# Patient Record
Sex: Male | Born: 1955 | Race: White | Hispanic: No | Marital: Married | Smoking: Never smoker
Health system: Southern US, Community
[De-identification: ages and names within clinical notes are randomized; demographics above are authoritative.]

## PROBLEM LIST (undated history)

## (undated) DIAGNOSIS — E785 Hyperlipidemia, unspecified: Secondary | ICD-10-CM

## (undated) DIAGNOSIS — I10 Essential (primary) hypertension: Secondary | ICD-10-CM

## (undated) DIAGNOSIS — L570 Actinic keratosis: Secondary | ICD-10-CM

## (undated) DIAGNOSIS — C61 Malignant neoplasm of prostate: Secondary | ICD-10-CM

## (undated) DIAGNOSIS — R972 Elevated prostate specific antigen [PSA]: Secondary | ICD-10-CM

## (undated) HISTORY — DX: Essential (primary) hypertension: I10

## (undated) HISTORY — DX: Elevated prostate specific antigen (PSA): R97.20

## (undated) HISTORY — DX: Actinic keratosis: L57.0

## (undated) HISTORY — PX: HERNIA REPAIR: SHX51

## (undated) HISTORY — DX: Hyperlipidemia, unspecified: E78.5

---

## 2015-02-25 DIAGNOSIS — R972 Elevated prostate specific antigen [PSA]: Secondary | ICD-10-CM | POA: Insufficient documentation

## 2015-02-25 DIAGNOSIS — E7800 Pure hypercholesterolemia, unspecified: Secondary | ICD-10-CM | POA: Insufficient documentation

## 2015-02-25 DIAGNOSIS — I10 Essential (primary) hypertension: Secondary | ICD-10-CM | POA: Insufficient documentation

## 2015-03-01 ENCOUNTER — Ambulatory Visit (INDEPENDENT_AMBULATORY_CARE_PROVIDER_SITE_OTHER): Payer: 59 | Admitting: Urology

## 2015-03-01 ENCOUNTER — Encounter: Payer: Self-pay | Admitting: Urology

## 2015-03-01 VITALS — BP 164/95 | HR 134 | Ht 71.0 in | Wt 214.9 lb

## 2015-03-01 DIAGNOSIS — N401 Enlarged prostate with lower urinary tract symptoms: Secondary | ICD-10-CM

## 2015-03-01 DIAGNOSIS — N138 Other obstructive and reflux uropathy: Secondary | ICD-10-CM

## 2015-03-01 DIAGNOSIS — R972 Elevated prostate specific antigen [PSA]: Secondary | ICD-10-CM

## 2015-03-01 NOTE — Progress Notes (Addendum)
03/01/2015 2:38 PM   Charna Busman 08-23-1955 YE:[id number]  Referring provider: [name], MD [place] [facility] [place], [place] [address]  Chief Complaint  Patient presents with  . Elevated PSA    referred by Dr. Doy Hutching    HPI: Patient is a 59 year old Caucasian male with a PSA of 36.01 ng/mL on 02/18/2015 referred to [place] by his PCP, Dr. Doy Hutching, for further evaluation and management.  He states he has not seen a doctor in a good while and he does not have any previous PSA's.  He has no urinary complaints at this time.  He does not admit to incontinence.  His IPSS score is 4/1.  He denies any difficulty with erections.  He is not having painful erections or curvatures with erections.  His SHIM score is 25.  He has no family history of prostate cancer.        IPSS      03/01/15 1400       International Prostate Symptom Score   How often have you had the sensation of not emptying your bladder? Not at All     How often have you had to urinate less than every two hours? Less than 1 in 5 times     How often have you found you stopped and started again several times when you urinated? Less than 1 in 5 times     How often have you found it difficult to postpone urination? Less than 1 in 5 times     How often have you had a weak urinary stream? Not at All     How often have you had to strain to start urination? Not at All     How many times did you typically get up at night to urinate? 1 Time     Total IPSS Score 4     Quality of Life due to urinary symptoms   If you were to spend the rest of your life with your urinary condition just the way it is now how would you feel about that? Pleased        Score:  1-7 Mild 8-19 Moderate 20-35 Severe      SHIM      03/01/15 1416       SHIM: Over the last 6 months:   How do you rate your confidence that you could get and keep an erection? Very High     When you had erections with sexual stimulation, how  often were your erections hard enough for penetration (entering your partner)? Almost Always or Always     During sexual intercourse, how often were you able to maintain your erection after you had penetrated (entered) your partner? Not Difficult     During sexual intercourse, how difficult was it to maintain your erection to completion of intercourse? Not Difficult     When you attempted sexual intercourse, how often was it satisfactory for you? Not Difficult     SHIM Total Score   SHIM 25        Score: 1-7 Severe ED 8-11 Moderate ED 12-16 Mild-Moderate ED 17-21 Mild ED 22-25 No ED  He denies any gross hematuria, suprapubic pain or dysuria.  He is not experiencing any fevers, chills, nausea or vomiting.  He has not had any bone pain, appetite loss or weight loss.     PMH: Past Medical History  Diagnosis Date  . Elevated PSA   . HLD (  hyperlipidemia)   . HTN (hypertension)     Surgical History: Past Surgical History  Procedure Laterality Date  . Hernia repair      Home Medications:    Medication List    Notice  As of 03/01/2015  2:38 PM   You have not been prescribed any medications.      Allergies: No Known Allergies  Family History: Family History  Problem Relation Age of Onset  . Kidney disease Neg Hx   . Prostate cancer Neg Hx     Social History:  reports that he has never smoked. He does not have any smokeless tobacco history on file. He reports that he drinks alcohol. He reports that he does not use illicit drugs.  ROS: UROLOGY Frequent Urination?: No Hard to postpone urination?: No Burning/pain with urination?: No Get up at night to urinate?: No Leakage of urine?: No Urine stream starts and stops?: No Trouble starting stream?: No Do you have to strain to urinate?: No Blood in urine?: No Urinary tract infection?: No Sexually transmitted disease?: No Injury to kidneys or bladder?: No Painful intercourse?: No Weak stream?: No Erection problems?:  No Penile pain?: No  Gastrointestinal Nausea?: No Vomiting?: No Indigestion/heartburn?: No Diarrhea?: No Constipation?: No  Constitutional Fever: No Night sweats?: No Weight loss?: No Fatigue?: No  Skin Skin rash/lesions?: No Itching?: No  Eyes Blurred vision?: No Double vision?: No  Ears/Nose/Throat Sore throat?: No Sinus problems?: No  Hematologic/Lymphatic Swollen glands?: No Easy bruising?: No  Cardiovascular Leg swelling?: No Chest pain?: No  Respiratory Cough?: No Shortness of breath?: No  Endocrine Excessive thirst?: No  Musculoskeletal Back pain?: No Joint pain?: No  Neurological Headaches?: No Dizziness?: No  Psychologic Depression?: No Anxiety?: No  Physical Exam: BP 164/95 mmHg  Pulse 134  Ht 5\' 11"  (1.803 m)  Wt 214 lb 14.4 oz (97.478 kg)  BMI 29.99 kg/m2  Constitutional: Well nourished. Alert and oriented, No acute distress. HEENT: [place] AT, moist mucus membranes. Trachea midline, no masses. Cardiovascular: No clubbing, cyanosis, or edema. Respiratory: Normal respiratory effort, no increased work of breathing. GI: Abdomen is soft, non tender, non distended, no abdominal masses. Liver and spleen not palpable.  No hernias appreciated.  Stool sample for occult testing is not indicated.   GU: No CVA tenderness.  No bladder fullness or masses.  Patient with circumcised phallus.  Urethral meatus is patent.  No penile discharge. No penile lesions or rashes. Scrotum without lesions, cysts, rashes and/or edema.  Testicles are located scrotally bilaterally. No masses are appreciated in the testicles. Left and right epididymis are normal. Rectal: Patient with  normal sphincter tone. Anus and perineum without scarring or rashes. No rectal masses are appreciated. Prostate is approximately 45 grams, no nodules are appreciated. Seminal vesicles are normal. Skin: No rashes, bruises or suspicious lesions. Lymph: No cervical or inguinal  adenopathy. Neurologic: Grossly intact, no focal deficits, moving all 4 extremities. Psychiatric: Normal mood and affect.  Laboratory Data: PSA History  36.01 ng/mL on 02/18/2015  Assessment & Plan:    1. Elevated PSA:    Patient's PSA was found to be 36.01 ng/mL on a routine exam.  I will repeat the value today.  I did discuss a prostate biopsy with the patient.  The procedure is explained and the risks involved, such as blood in urine, blood in stool, blood in semen, infection, urinary retention, and on rare occasions sepsis and death.  Patient understands the risks as explained to him.  I stated  that if the PSA returned elevated, I would recommend undergoing a biopsy.    - PSA  2. BPH with LUTS:   IPSS score is 4/1.  We will continue to monitor.  Patient may be undergoing a prostate biopsy in the future pending repeated PSA results.     Return for pending labs.  Zara Council, [facility] [address] [place], [place] [address] [phone]

## 2015-03-02 ENCOUNTER — Telehealth: Payer: Self-pay

## 2015-03-02 LAB — PSA: PROSTATE SPECIFIC AG, SERUM: 33.2 ng/mL — AB (ref 0.0–4.0)

## 2015-03-02 NOTE — Telephone Encounter (Signed)
Spoke with pt in reference to PSA and prostate bx. Pt stated that he wanted to call back to make appt. Pt stated he wanted to talk bx over with his wife.

## 2015-03-02 NOTE — Telephone Encounter (Signed)
-----   Message from Nori Riis, PA-C sent at 03/02/2015  8:58 AM EST ----- Patient's PSA did not lessen.  He will need to be scheduled for the biopsy.

## 2015-03-31 ENCOUNTER — Other Ambulatory Visit: Payer: Self-pay | Admitting: Urology

## 2015-03-31 ENCOUNTER — Ambulatory Visit (INDEPENDENT_AMBULATORY_CARE_PROVIDER_SITE_OTHER): Payer: BLUE CROSS/BLUE SHIELD | Admitting: Urology

## 2015-03-31 VITALS — BP 191/108 | HR 97 | Ht 71.0 in | Wt 195.0 lb

## 2015-03-31 DIAGNOSIS — R972 Elevated prostate specific antigen [PSA]: Secondary | ICD-10-CM | POA: Diagnosis not present

## 2015-03-31 MED ORDER — GENTAMICIN SULFATE 40 MG/ML IJ SOLN
80.0000 mg | Freq: Once | INTRAMUSCULAR | Status: AC
  Administered 2015-03-31: 80 mg via INTRAMUSCULAR

## 2015-03-31 MED ORDER — LEVOFLOXACIN 500 MG PO TABS
500.0000 mg | ORAL_TABLET | Freq: Once | ORAL | Status: AC
  Administered 2015-03-31: 500 mg via ORAL

## 2015-03-31 NOTE — Patient Instructions (Signed)

## 2015-03-31 NOTE — Progress Notes (Signed)
   03/31/2015 11:40 AM   Charna Busman Nov 24, 1955 [id number]  Referring provider: [name], MD [facility] [place], [place] [address]  Chief Complaint  Patient presents with  . Prostate Biopsy    HPI: 60 year old male with elevated PSA to 36 on routine physical exam.  Prostate was unremarkable on exam.  He has no LUTS/ ED.    No family history of prostate cancer.  He returns to the office today for a prostate biopsy.   PMH: Past Medical History  Diagnosis Date  . Elevated PSA   . HLD (hyperlipidemia)   . HTN (hypertension)     Surgical History: Past Surgical History  Procedure Laterality Date  . Hernia repair      Home Medications:    Medication List    Notice  As of 03/31/2015 11:40 AM   You have not been prescribed any medications.      Allergies: No Known Allergies  Family History: Family History  Problem Relation Age of Onset  . Kidney disease Neg Hx   . Prostate cancer Neg Hx     Social History:  reports that he has never smoked. He does not have any smokeless tobacco history on file. He reports that he drinks alcohol. He reports that he does not use illicit drugs.  Physical Exam: BP 191/108 mmHg  Pulse 97  Ht 5\' 11"  (1.803 m)  Wt 195 lb (88.451 kg)  BMI 27.21 kg/m2  Constitutional:  Alert and oriented, No acute distress. HEENT: [place] AT, moist mucus membranes.  Trachea midline, no masses. Cardiovascular: No clubbing, cyanosis, or edema. Respiratory: Normal respiratory effort, no increased work of breathing. GI: Abdomen is soft, nontender, nondistended, no abdominal masses GU: No CVA tenderness.   Rectal exam: Exam repeated today which shows 40 cc prostate, no nodules, nontender. Normal external sphincter. Skin: No rashes, bruises or suspicious lesions. Neurologic: Grossly intact, no focal deficits, moving all 4 extremities. Psychiatric: Normal mood and affect.  Prostate Biopsy Procedure   Informed  consent was obtained after discussing risks/benefits of the procedure.  A time out was performed to ensure correct patient identity.  Pre-Procedure: - Last PSA Level: 36 - Gentamicin given prophylactically - Levaquin 500 mg administered PO -Transrectal Ultrasound performed revealing a 39 gm prostate -No significant hypoechoic or median lobe noted  Procedure: - Prostate block performed using 10 cc 1% lidocaine and biopsies taken from sextant areas, a total of 12 under ultrasound guidance.  Post-Procedure: - Patient tolerated the procedure well - He was counseled to seek immediate medical attention if experiences any severe pain, significant bleeding, or fevers - Return in one week to discuss biopsy results  Assessment & Plan:    1. Elevated PSA S/p uncomplicated biopsy - gentamicin (GARAMYCIN) injection 80 mg; Inject 2 mLs (80 mg total) into the muscle once. - levofloxacin (LEVAQUIN) tablet 500 mg; Take 1 tablet (500 mg total) by mouth once.  Return for f/u already scheduled for biopsy results.  Hollice Espy, MD  [facility] [address], [place], [place] [address] [phone]

## 2015-04-06 ENCOUNTER — Telehealth: Payer: Self-pay | Admitting: Urology

## 2015-04-06 DIAGNOSIS — C61 Malignant neoplasm of prostate: Secondary | ICD-10-CM

## 2015-04-06 LAB — PATHOLOGY REPORT

## 2015-04-06 NOTE — Telephone Encounter (Addendum)
Called to discuss prostate biopsy results, made aware of large volume cleason 3+3, 3+4 involving all cores up tp 95% of tissue (highest volume at the base).  I would like to go ahead and arrange for CT abdomen pelvis along with a bone scan prior to his follow-up appointment so that our discussion will be more meaningful. He is agreeable with this plan. We may need to push back his appointment a little.  I will also have our cancer navigator Marita Kansas reach out to him.    Hollice Espy, MD

## 2015-04-06 NOTE — Telephone Encounter (Signed)
Pt called returning your call.  I told him you would be back in the office in the morning.  He said you could reach him on his cell.

## 2015-04-06 NOTE — Telephone Encounter (Signed)
Done  Patient is scheduled for 04-11-15 and he has been notified  Sharyn Lull

## 2015-04-07 ENCOUNTER — Telehealth: Payer: Self-pay

## 2015-04-07 NOTE — Telephone Encounter (Signed)
  Oncology Nurse Navigator Documentation  Navigator Location: [facility] (04/07/15 1100) Navigator Encounter Type: Introductory phone call;Telephone (04/07/15 1100) Telephone: Outgoing Call (04/07/15 1100)   Confirmed Diagnosis Date: 04/06/15 (04/07/15 1100)       Treatment Phase: Pre-Tx/Tx Discussion (04/07/15 1100)                            Time Spent with Patient: 15 (04/07/15 1100)   Navigation introductory call made. No answer. Hippa appropriate voicemail left.

## 2015-04-11 ENCOUNTER — Ambulatory Visit
Admission: RE | Admit: 2015-04-11 | Discharge: 2015-04-11 | Disposition: A | Discharge status: Home / Self Care | Payer: BLUE CROSS/BLUE SHIELD | Source: Ambulatory Visit | Attending: Urology | Admitting: Urology

## 2015-04-11 ENCOUNTER — Encounter
Admission: RE | Admit: 2015-04-11 | Discharge: 2015-04-11 | Disposition: A | Discharge status: Home / Self Care | Payer: BLUE CROSS/BLUE SHIELD | Source: Ambulatory Visit | Attending: Urology | Admitting: Urology

## 2015-04-11 DIAGNOSIS — C61 Malignant neoplasm of prostate: Secondary | ICD-10-CM | POA: Diagnosis present

## 2015-04-11 DIAGNOSIS — R9721 Rising PSA following treatment for malignant neoplasm of prostate: Secondary | ICD-10-CM | POA: Diagnosis present

## 2015-04-11 MED ORDER — TECHNETIUM TC 99M MEDRONATE IV KIT
25.0000 | PACK | Freq: Once | INTRAVENOUS | Status: AC | PRN
  Administered 2015-04-11: 23.587 via INTRAVENOUS

## 2015-04-11 MED ORDER — IOHEXOL 300 MG/ML  SOLN
100.0000 mL | Freq: Once | INTRAMUSCULAR | Status: AC | PRN
  Administered 2015-04-11: 100 mL via INTRAVENOUS

## 2015-04-12 ENCOUNTER — Ambulatory Visit (INDEPENDENT_AMBULATORY_CARE_PROVIDER_SITE_OTHER): Payer: BLUE CROSS/BLUE SHIELD | Admitting: Urology

## 2015-04-12 ENCOUNTER — Encounter: Payer: Self-pay | Admitting: Urology

## 2015-04-12 VITALS — BP 192/106 | HR 109 | Ht 71.0 in | Wt 216.3 lb

## 2015-04-12 DIAGNOSIS — C61 Malignant neoplasm of prostate: Secondary | ICD-10-CM

## 2015-04-12 DIAGNOSIS — I10 Essential (primary) hypertension: Secondary | ICD-10-CM

## 2015-04-12 NOTE — Progress Notes (Signed)
04/12/2015 4:54 PM   Brad Patrick 11-04-1955 [id number]  Referring provider: [name], MD [facility] [place], [place] [address]  Chief Complaint  Patient presents with  . Prostate Cancer  . Results    HPI: 60 yo M with newly dx prostate cancer, Gleason 3+3, 3+4 involving all cores up tp 95% of tissue (highest volume at the base).  His PSA 33.2 ng/ dL at the time of biopsy on 03/31/15.   Prostate exam unremarkable. TRUS vol 39cc.    Staging with CT abd/ pelvis and bone scan 04/11/15 negative.    LIH (open) repair in college.    No baseline voiding difficulties or erectile dysfunction. IPSS and SHIM below.  He returns to the office today to discuss his options for treatment.      SHIM      03/01/15 1416       SHIM: Over the last 6 months:   How do you rate your confidence that you could get and keep an erection? Very High     When you had erections with sexual stimulation, how often were your erections hard enough for penetration (entering your partner)? Almost Always or Always     During sexual intercourse, how often were you able to maintain your erection after you had penetrated (entered) your partner? Not Difficult     During sexual intercourse, how difficult was it to maintain your erection to completion of intercourse? Not Difficult     When you attempted sexual intercourse, how often was it satisfactory for you? Not Difficult     SHIM Total Score   SHIM 25            IPSS      03/01/15 1400       International Prostate Symptom Score   How often have you had the sensation of not emptying your bladder? Not at All     How often have you had to urinate less than every two hours? Less than 1 in 5 times     How often have you found you stopped and started again several times when you urinated? Less than 1 in 5 times     How often have you found it difficult to postpone urination? Less than 1 in 5 times     How often have you  had a weak urinary stream? Not at All     How often have you had to strain to start urination? Not at All     How many times did you typically get up at night to urinate? 1 Time     Total IPSS Score 4     Quality of Life due to urinary symptoms   If you were to spend the rest of your life with your urinary condition just the way it is now how would you feel about that? Pleased          PMH: Past Medical History  Diagnosis Date  . Elevated PSA   . HLD (hyperlipidemia)   . HTN (hypertension)     Surgical History: Past Surgical History  Procedure Laterality Date  . Hernia repair      Home Medications:    Medication List    Notice  As of 04/12/2015  4:54 PM   You have not been prescribed any medications.      Allergies: No Known Allergies  Family History: Family History  Problem Relation Age of Onset  . Kidney disease Neg  Hx   . Prostate cancer Neg Hx     Social History:  reports that he has never smoked. He does not have any smokeless tobacco history on file. He reports that he drinks alcohol. He reports that he does not use illicit drugs.  ROS: UROLOGY Frequent Urination?: No Hard to postpone urination?: No Burning/pain with urination?: No Get up at night to urinate?: No Leakage of urine?: No Urine stream starts and stops?: No Trouble starting stream?: No Do you have to strain to urinate?: No Blood in urine?: No Urinary tract infection?: No Sexually transmitted disease?: No Injury to kidneys or bladder?: No Painful intercourse?: No Weak stream?: No Erection problems?: No Penile pain?: No  Gastrointestinal Nausea?: No Vomiting?: No Indigestion/heartburn?: No Diarrhea?: No Constipation?: No  Constitutional Fever: No Night sweats?: No Weight loss?: No Fatigue?: No  Skin Skin rash/lesions?: No Itching?: No  Eyes Blurred vision?: No Double vision?: No  Ears/Nose/Throat Sore throat?: No Sinus problems?: No  Hematologic/Lymphatic Swollen  glands?: No Easy bruising?: No  Cardiovascular Leg swelling?: No Chest pain?: No  Respiratory Cough?: No Shortness of breath?: No  Endocrine Excessive thirst?: No  Musculoskeletal Back pain?: No Joint pain?: No  Neurological Headaches?: No Dizziness?: No  Psychologic Depression?: No Anxiety?: No  Physical Exam: BP 192/106 mmHg  Pulse 109  Ht 5\' 11"  (1.803 m)  Wt 216 lb 4.8 oz (98.113 kg)  BMI 30.18 kg/m2  Constitutional:  Alert and oriented, No acute distress. HEENT: [place] AT, moist mucus membranes.  Trachea midline, no masses. Cardiovascular: No clubbing, cyanosis, or edema.  CTAB. Respiratory: Normal respiratory effort, no increased work of breathing. RRR. Skin: No rashes, bruises or suspicious lesions. Lymph: No cervical or inguinal adenopathy. Neurologic: Grossly intact, no focal deficits, moving all 4 extremities. Psychiatric: Normal mood and affect.  Laboratory Data: PSA as above  Pertinent Imaging: Study Result     CLINICAL DATA: New diagnosis of prostate malignancy, high Gleason score, elevated PSA, no current skeletal symptoms.  EXAM: NUCLEAR MEDICINE WHOLE BODY BONE SCAN  TECHNIQUE: Whole body anterior and posterior images were obtained approximately 3 hours after intravenous injection of radiopharmaceutical.  RADIOPHARMACEUTICALS: 23.587 mCi Technetium-97m MDP IV  COMPARISON: None in PACs  FINDINGS: There is adequate uptake of the radiopharmaceutical by the skeleton. Adequate soft tissue clearance and renal activity is present.  There is a small focus of increased uptake to the right of midline in the cervical spine that is likely degenerative. Elsewhere spinal uptake is normal. Activity within the ribs, sternum, and pectoral girdle is within the limits of normal for age. There are degenerative changes involving both wrists. Uptake within the pelvis and lower extremities is also within the limits of normal with exception of  degenerative degenerative type uptake in the medial aspect of the right ankle.  IMPRESSION: There are no findings suspicious for metastatic disease.   Electronically Signed  By: David Martinique M.D.  On: 04/11/2015 13:10     Study Result     CLINICAL DATA: Elevated PSA, new diagnosis of prostate cancer.  EXAM: CT ABDOMEN AND PELVIS WITH CONTRAST  TECHNIQUE: Multidetector CT imaging of the abdomen and pelvis was performed using the standard protocol following bolus administration of intravenous contrast.  CONTRAST: 121mL OMNIPAQUE IOHEXOL 300 MG/ML SOLN  COMPARISON: None.  FINDINGS: Lower chest: Lung bases show no acute findings. Heart size normal. No pericardial or pleural effusion.  Hepatobiliary: Scattered low attenuation lesions in the liver are sub cm in size and too small to characterize.  Liver may be slightly decreased in attenuation diffusely. Liver and gallbladder are otherwise unremarkable. No biliary ductal dilatation.  Pancreas: Negative.  Spleen: Negative.  Adrenals/Urinary Tract: Adrenal glands and kidneys are unremarkable. Ureters are decompressed. Bladder appears slightly thick-walled but is somewhat under distended.  Stomach/Bowel: Stomach is unremarkable. There appears to be nodular thickening along the anterior wall of the proximal duodenum (series 2, image 25 and series 4, image 8), measuring approximately 1.2 x 1.5 cm (approximately 2 cm from the pylorus). Small bowel and colon are otherwise unremarkable. Appendix is not readily visualized.  Vascular/Lymphatic: Minimal atherosclerotic calcification of the arterial vasculature. No pathologically enlarged lymph nodes.  Reproductive: Prostate is normal in size.  Other: No free fluid. Mesenteries and peritoneum are unremarkable.  Musculoskeletal: No worrisome lytic or sclerotic lesions.  IMPRESSION: 1. No evidence of metastatic disease. 2. Possible nodular lesion  along the proximal duodenal wall. Consider endoscopic evaluation, as clinically indicated, as malignancy cannot be excluded. These results will be called to the ordering clinician or representative by the Radiologist Assistant, and communication documented in the PACS or zVision Dashboard. 3. Liver may be slightly fatty.   Electronically Signed  By: Lorin Picket M.D.  On: 04/11/2015 10:32    Assessment & Plan:    1. Prostate cancer [facility])  60 yo M with newly dx high volume cT1c Gleason 3+4 prostate cancer (high risk based on PSA) with negative metastatic work up.  The patient was counseled about the natural history of prostate cancer and the standard treatment options that are available for prostate cancer. It was explained to him how his age and life expectancy, clinical stage, Gleason score, and PSA affect his prognosis, the decision to proceed with additional staging studies, as well as how that information influences recommended treatment strategies. We discussed the roles for active surveillance, radiation therapy, surgical therapy, androgen deprivation, as well as ablative therapy options for the treatment of prostate cancer as appropriate to his individual cancer situation. We discussed the risks and benefits of these options with regard to their impact on cancer control and also in terms of potential adverse events, complications, and impact on quality of life particularly related to urinary, bowel, and sexual function. The patient was encouraged to ask questions throughout the discussion today and all questions were answered to his stated satisfaction. In addition, the patient was provided with and/or directed to appropriate resources and literature for further education about prostate cancer treatment options.  We discussed surgical therapy for prostate cancer including the different available surgical approaches. We discussed, in detail, the risks and expectations of surgery with  regard to cancer control, urinary control, and erectile dysfunction as well as expected post operative cover he processed. Additional risks of surgery including but not limalited to bleeding, infection, hernia formation, nerve damage, steel formation, bowel/rect injury, potentially necessitating colostomy, damage to the urinary tract resulting in urinary leakage, urethral stricture, and cardiopulmonary risk such as myocardial infarction, stroke, death, thromboembolism etc. were explained. The risk of open surgical conversion for robotics/laparoscopic prostatectomy is also discussed.  Given his young age, high volume disease, and high risk of recurrence based on MSK nomogram, I would recommend aggressive multimodal therapy starting with non-nerve sparing prostatectomy with bilateral lymph node disection for local control.  With this approach, he may also need adjuvant radiation based on surgical pathology given high risk of seminal vesicle involvement or for biochemical recurrence down the road.    We discussed other alternatives including radiation therapy with androgen deprivation as  a primary treatment. He will be referred to radiation oncology for another opinion.    I also offered him a second opinion which she declined at this time.  He will call our office moving forward to let us know how he'd like to proceed.  If it like to be booked for surgery, we will have him see physical therapy for pelvic floor Kegel exercise teaching prior to surgery per our protocol.  - Ambulatory referral to Radiation Oncology  2. Essential hypertension Profoundly hypertensive today in the office as he has been on several occasions. He reports that this is likely related to stress. No dizziness, headaches, or any other symptoms associated. I have advised him to follow up with his PCP in the near future to address his blood pressure.   Hollice Espy, MD  [facility] [address]  [place], [place] [address] [phone]  I spent 45 min with this patient of which greater than 50% was spent in counseling and coordination of care with the patient.

## 2015-04-13 ENCOUNTER — Telehealth: Payer: Self-pay

## 2015-04-13 ENCOUNTER — Encounter: Payer: Self-pay | Admitting: Urology

## 2015-04-13 NOTE — Telephone Encounter (Signed)
  Oncology Nurse Navigator Documentation  Navigator Location: [facility] (04/13/15 1100)   Telephone: Appt Confirmation/Clarification (04/13/15 1100)                 Interventions: Referrals;Coordination of Care (04/13/15 1100)                      Time Spent with Patient: 15 (04/13/15 1100)   Dr Baruch Gouty can see 04/14/15 at 0900 for consultation. Voicemail left regarding to call and confirm if able to make this appt.

## 2015-04-13 NOTE — Telephone Encounter (Signed)
  Oncology Nurse Navigator Documentation  Navigator Location: [facility] (04/13/15 1300)                     Interventions: Coordination of Care (04/13/15 1300)                      Time Spent with Patient: 15 (04/13/15 1300)   Appt changed and confirmed for 04/13/15 1100 with Dr Baruch Gouty

## 2015-04-14 ENCOUNTER — Encounter: Payer: Self-pay | Admitting: Radiation Oncology

## 2015-04-14 ENCOUNTER — Ambulatory Visit
Admission: RE | Admit: 2015-04-14 | Discharge: 2015-04-14 | Disposition: A | Discharge status: Home / Self Care | Payer: BLUE CROSS/BLUE SHIELD | Source: Ambulatory Visit | Attending: Radiation Oncology | Admitting: Radiation Oncology

## 2015-04-14 ENCOUNTER — Ambulatory Visit: Payer: BLUE CROSS/BLUE SHIELD | Admitting: Radiation Oncology

## 2015-04-14 VITALS — BP 163/99 | HR 93 | Temp 96.8°F | Resp 18 | Wt 215.8 lb

## 2015-04-14 DIAGNOSIS — C61 Malignant neoplasm of prostate: Secondary | ICD-10-CM

## 2015-04-14 HISTORY — DX: Malignant neoplasm of prostate: C61

## 2015-04-14 NOTE — Consult Note (Signed)
Except an outstanding is perfect of Radiation Oncology NEW PATIENT EVALUATION  Name: Brad Patrick  MRN: [id number]  Date:   04/14/2015     DOB: 20-Sep-1955   This 60 y.o. 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  REFERRING PHYSICIAN: [name], MD  CHIEF COMPLAINT:  Chief Complaint  Patient presents with  . Prostate Cancer    Pt is here for initial consultation of prostate cancer.      DIAGNOSIS: The encounter diagnosis was Malignant neoplasm of prostate ([place]).   PREVIOUS INVESTIGATIONS:  CT scan and bone scan reviewed Clinical notes reviewed Pathology report reviewed  HPI: Patient is a 60 year old male presented to his PMD within elevated PSA of 33.2 which prompted referral to urology. Patient underwent core biopsies of his prostate all 12 positive for adenocarcinoma a mixture of Gleason 6 (3+3) and Gleason 7 (3+4). Patient underwent CT scan of abdomen and pelvis showing no evidence of pelvic adenopathy or extracapsular extension did have some nodularity in his duodenum for which possible endoscopy has been recommended. His bone scan was also unremarkable showing no evidence of metastatic disease. Patient does have some slight frequency of urination nocturia 1. He has been evaluated and consult by urology is seen today for consideration of treatment options for radiation oncology's standpoint.  PLANNED TREATMENT REGIMEN: Probable robotic-assisted prostatectomy  PAST MEDICAL HISTORY:  has a past medical history of Elevated PSA; HLD (hyperlipidemia); HTN (hypertension); and Prostate cancer ([place]).    PAST SURGICAL HISTORY:  Past Surgical History  Procedure Laterality Date  . Hernia repair      FAMILY HISTORY: family history is negative for Kidney disease and Prostate cancer.  SOCIAL HISTORY:  reports that he has never smoked. He does not have any  smokeless tobacco history on file. He reports that he drinks alcohol. He reports that he does not use illicit drugs.  ALLERGIES: Review of patient's allergies indicates no known allergies.  MEDICATIONS:  Current Outpatient Prescriptions  Medication Sig Dispense Refill  . bisoprolol-hydrochlorothiazide (ZIAC) 5-6.25 MG tablet Take by mouth.     No current facility-administered medications for this encounter.    ECOG PERFORMANCE STATUS:  0 - Asymptomatic  REVIEW OF SYSTEMS:  Patient denies any weight loss, fatigue, weakness, fever, chills or night sweats. Patient denies any loss of vision, blurred vision. Patient denies any ringing  of the ears or hearing loss. No irregular heartbeat. Patient denies heart murmur or history of fainting. Patient denies any chest pain or pain radiating to her upper extremities. Patient denies any shortness of breath, difficulty breathing at night, cough or hemoptysis. Patient denies any swelling in the lower legs. Patient denies any nausea vomiting, vomiting of blood, or coffee ground material in the vomitus. Patient denies any stomach pain. Patient states has had normal bowel movements no significant constipation or diarrhea. Patient denies any dysuria, hematuria or significant nocturia. Patient denies any problems walking, swelling in the joints or loss of balance. Patient denies any skin changes, loss of hair or loss of weight. Patient denies any excessive worrying or anxiety or significant depression. Patient denies any problems with insomnia. Patient denies excessive thirst, polyuria, polydipsia. Patient denies any swollen glands, patient denies easy bruising or easy bleeding. Patient denies any recent infections, allergies or URI. Patient "s visual fields have not changed significantly in recent time.    PHYSICAL EXAM: BP 163/99 mmHg  Pulse 93  Temp(Src) 96.8 F (36 C)  Resp 18  Wt 215 lb 13.3 oz (97.9 kg) On rectal exam rectal sphincter tone is good  prostate is small contracted without evidence of nodularity or mass sulcus is preserved bilaterally. Well-developed well-nourished patient in NAD. HEENT reveals PERLA, EOMI, discs not visualized.  Oral cavity is clear. No oral mucosal lesions are identified. Neck is clear without evidence of cervical or supraclavicular adenopathy. Lungs are clear to A&P. Cardiac examination is essentially unremarkable with regular rate and rhythm without murmur rub or thrill. Abdomen is benign with no organomegaly or masses noted. Motor sensory and DTR levels are equal and symmetric in the upper and lower extremities. Cranial nerves II through XII are grossly intact. Proprioception is intact. No peripheral adenopathy or edema is identified. No motor or sensory levels are noted. Crude visual fields are within normal range.  LABORATORY DATA: Pathology reports reviewed    RADIOLOGY RESULTS: Bone scan and CT scans reviewed   IMPRESSION: Stage IIB adenocarcinoma prostate in 60 year old male  PLAN: I have run the [employer] nomogram with patient's treatment factors showing 99% probability of cancer specific survival after radical prostatectomy although only 8:30 percent at 5 year progression free probability after radical prostatectomy. Patient has only 9% chance of lymph node involvement possible 80% chance of extracapsular extension. I've explained the risks and benefits of both surgery and radiation therapy. I have reviewed both external beam treatment as well as I-125 interstitial implant. I have expressed that I favor a young patient who is in good overall performance status to undergo radical prostatectomy with his known stage of disease since we can always use radiation for salvage although patient to do receive upfront treatment with radiation surgical salvage is not an option. Patient and wife are leaning towards surgical resection. Would make further recommendations on adjuvant treatment after review of  his final pathology. I will discuss the case personally with urology. Patient has contact my office with any other further questions or concerns.  I would like to take this opportunity for allowing me to participate in the care of your patient.Armstead Peaks., MD

## 2015-04-16 ENCOUNTER — Encounter: Payer: Self-pay | Admitting: Urology

## 2015-04-20 ENCOUNTER — Telehealth: Payer: Self-pay | Admitting: Urology

## 2015-04-20 ENCOUNTER — Telehealth: Payer: Self-pay

## 2015-04-20 NOTE — Telephone Encounter (Signed)
  Oncology Nurse Navigator Documentation  Navigator Location: [facility] (04/20/15 0900) Navigator Encounter Type: Telephone (04/20/15 0900) Telephone: Incoming Call (04/20/15 0900)                                        Time Spent with Patient: 15 (04/20/15 0900)   Received call from Mr Dragone. He would like to talk further today regarding treatment. Will meet today at 1400

## 2015-04-20 NOTE — Telephone Encounter (Signed)
Patient would like for you  To give him a call as your convenience. He can be reached at [phone]. He didn't say why he wanted you to call.  Thanks,  Sharyn Lull

## 2015-04-20 NOTE — Telephone Encounter (Signed)
Patient had more questions about prostatectomy, primary questions about erectile dysfunction.  Again, discussed that I would not recommend nerve sparing given his high volume of disease.  As such, I do suspect some degree of ED post op which will likely require augmentation with medications, injections, VED or even consideration of IPP down the road if severe (which is possible).   All of his questions were answered.  He would like to discuss this further with his wife.    He will call Amy our surgical scheduler to let her know if he would to book surgery.  Hollice Espy, MD

## 2015-04-26 ENCOUNTER — Other Ambulatory Visit: Payer: Self-pay | Admitting: Radiology

## 2015-04-26 DIAGNOSIS — C61 Malignant neoplasm of prostate: Secondary | ICD-10-CM

## 2015-05-05 ENCOUNTER — Encounter
Admission: RE | Admit: 2015-05-05 | Discharge: 2015-05-05 | Disposition: A | Discharge status: Home / Self Care | Payer: BLUE CROSS/BLUE SHIELD | Source: Ambulatory Visit | Attending: Urology | Admitting: Urology

## 2015-05-05 ENCOUNTER — Ambulatory Visit: Payer: BLUE CROSS/BLUE SHIELD | Attending: Urology | Admitting: Physical Therapy

## 2015-05-05 DIAGNOSIS — R531 Weakness: Secondary | ICD-10-CM | POA: Diagnosis present

## 2015-05-05 DIAGNOSIS — Z01812 Encounter for preprocedural laboratory examination: Secondary | ICD-10-CM | POA: Insufficient documentation

## 2015-05-05 DIAGNOSIS — M6289 Other specified disorders of muscle: Secondary | ICD-10-CM

## 2015-05-05 DIAGNOSIS — R279 Unspecified lack of coordination: Secondary | ICD-10-CM

## 2015-05-05 DIAGNOSIS — M629 Disorder of muscle, unspecified: Secondary | ICD-10-CM

## 2015-05-05 LAB — URINALYSIS COMPLETE WITH MICROSCOPIC (ARMC ONLY)
BILIRUBIN URINE: NEGATIVE
Bacteria, UA: NONE SEEN
Glucose, UA: NEGATIVE mg/dL
Hgb urine dipstick: NEGATIVE
KETONES UR: NEGATIVE mg/dL
Leukocytes, UA: NEGATIVE
Nitrite: NEGATIVE
PROTEIN: NEGATIVE mg/dL
SPECIFIC GRAVITY, URINE: 1.01 (ref 1.005–1.030)
Squamous Epithelial / LPF: NONE SEEN
pH: 5 (ref 5.0–8.0)

## 2015-05-05 LAB — CBC
HCT: 41 % (ref 40.0–52.0)
HEMOGLOBIN: 14.3 g/dL (ref 13.0–18.0)
MCH: 29 pg (ref 26.0–34.0)
MCHC: 35 g/dL (ref 32.0–36.0)
MCV: 82.9 fL (ref 80.0–100.0)
PLATELETS: 224 10*3/uL (ref 150–440)
RBC: 4.94 MIL/uL (ref 4.40–5.90)
RDW: 13.1 % (ref 11.5–14.5)
WBC: 7.4 10*3/uL (ref 3.8–10.6)

## 2015-05-05 LAB — TYPE AND SCREEN
ABO/RH(D): A POS
Antibody Screen: NEGATIVE

## 2015-05-05 LAB — BASIC METABOLIC PANEL
Anion gap: 6 (ref 5–15)
BUN: 14 mg/dL (ref 6–20)
CHLORIDE: 106 mmol/L (ref 101–111)
CO2: 27 mmol/L (ref 22–32)
CREATININE: 0.89 mg/dL (ref 0.61–1.24)
Calcium: 9.1 mg/dL (ref 8.9–10.3)
GFR calc Af Amer: >60 mL/min (ref >60)
GFR calc non Af Amer: >60 mL/min (ref >60)
Glucose, Bld: 100 mg/dL — ABNORMAL HIGH (ref 65–99)
Potassium: 4.3 mmol/L (ref 3.5–5.1)
SODIUM: 139 mmol/L (ref 135–145)

## 2015-05-05 LAB — ABO/RH: ABO/RH(D): A POS

## 2015-05-05 NOTE — Patient Instructions (Signed)
  Your procedure is scheduled on: Tuesday 05/17/15 Report to Day Surgery. 2ND FLOOR MEDICAL MALL ENTRANCE To find out your arrival time please call [phone] between 1PM - 3PM on Monday 05/16/15.  Remember: Instructions that are not followed completely may result in serious medical risk, up to and including death, or upon the discretion of your surgeon and anesthesiologist your surgery may need to be rescheduled.    __X__ 1. Do not eat food or drink liquids after midnight. No gum chewing or hard candies.     __X__ 2. No Alcohol for 24 hours before or after surgery.   ____ 3. Bring all medications with you on the day of surgery if instructed.    __X__ 4. Notify your doctor if there is any change in your medical condition     (cold, fever, infections).     Do not wear jewelry, make-up, hairpins, clips or nail polish.  Do not wear lotions, powders, or perfumes.   Do not shave 48 hours prior to surgery. Men may shave face and neck.  Do not bring valuables to the hospital.    [facility] is not responsible for any belongings or valuables.               Contacts, dentures or bridgework may not be worn into surgery.  Leave your suitcase in the car. After surgery it may be brought to your room.  For patients admitted to the hospital, discharge time is determined by your                treatment team.   Patients discharged the day of surgery will not be allowed to drive home.   Please read over the following fact sheets that you were given:   Surgical Site Infection Prevention   ____ Take these medicines the morning of surgery with A SIP OF WATER:    1. NONE  2.   3.   4.  5.  6.  ____ Fleet Enema (as directed)   __X__ Use CHG Soap as directed  ____ Use inhalers on the day of surgery  ____ Stop metformin 2 days prior to surgery    ____ Take 1/2 of usual insulin dose the night before surgery and none on the morning of surgery.   ____ Stop Coumadin/Plavix/aspirin on   ____  Stop Anti-inflammatories on    ____ Stop supplements until after surgery.    ____ Bring C-Pap to the hospital.

## 2015-05-05 NOTE — Pre-Procedure Instructions (Signed)
   Component Name Value Range  Vent Rate (bpm) 111   PR Interval (msec) 172   QRS Interval (msec) 96   QT Interval (msec) 330   QTc (msec) 448    Result Narrative  Sinus tachycardia Otherwise normal ECG No previous ECGs available I reviewed and concur with this report. Electronically signed [id number] MD, JEFFREY ([id number]) on 02/22/2015 7:49:49 AM

## 2015-05-06 LAB — URINE CULTURE: CULTURE: NO GROWTH

## 2015-05-06 NOTE — Therapy (Signed)
[place] MAIN [facility] SERVICES [address] [place], [place], [address] Phone: [phone]   Fax:  [phone]  Physical Therapy Evaluation  Patient Details  Name: Brad Patrick MRN: [id number] Date of Birth: [birth year] Referring Provider: Dr. Erlene Quan   Encounter Date: 05/05/2015      PT End of Session - 05/06/15 1412    Visit Number 1   Number of Visits 12   Date for PT Re-Evaluation 05/27/15   PT Start Time 1005   PT Stop Time 1110   PT Time Calculation (min) 65 min   Activity Tolerance Patient tolerated treatment well;No increased pain   Behavior During Therapy [facility] for tasks assessed/performed      Past Medical History  Diagnosis Date  . Elevated PSA   . HLD (hyperlipidemia)   . HTN (hypertension)   . Prostate cancer [facility])     Past Surgical History  Procedure Laterality Date  . Hernia repair      There were no vitals filed for this visit.  Visit Diagnosis:  Lack of coordination - Plan: PT plan of care cert/re-cert  Weakness - Plan: PT plan of care cert/re-cert  Fascial defect - Plan: PT plan of care cert/re-cert  Pelvic floor dysfunction - Plan: PT plan of care cert/re-cert      Subjective Assessment - 05/05/15 1032    Subjective Pt was referred to PT for pre-prostatectomy pelvic floor training. Pt was found to have high PSA levels at his physical check up and will be scheduled for his surgery  on 05/17/15.  Pt denies pelvic floor dysfunction, LBP, and nor any injuries at other parts of his body. 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  Pt has repeated lifting up to 50 # for 3-5x week  Hobbies: gardening, lawnwork, furniture shopping.      Patient is accompained by: Family member  wife   Pertinent History Daily bowel movements, denied straining.  Denied sexual dysfunction. Physical activities: Pt has not had any routine. Hx of hernia             [facility] PT  Assessment - 05/06/15 1044    Assessment   Medical Diagnosis Prostate Cancer    Referring Provider Dr. Erlene Quan    Precautions   Precautions None   Restrictions   Weight Bearing Restrictions No   Home Environment   Additional Comments 2 flights of stairs   16 stairs    Prior Function   Level of Independence Independent   Observation/Other Assessments   Observations slouch sitting, abdominal bulging upper quadrant along linea alba w/ bed mobility     Other Surveys  --  IPPS 1 pt, QOL 1 pt    Coordination   Gross Motor Movements are Fluid and Coordinated --  chest breathing    Fine Motor Movements are Fluid and Coordinated --  abdominal straining w/ cue for pelvic floor contraction   Other:   Other/ Comments lifting with breatholding    Posture/Postural Control   Posture Comments lumbopelvic instability w/ hip flexion in supine    Bed Mobility   Bed Mobility --  crunch method                    [facility] Adult PT Treatment/Exercise - 05/06/15 1044    Self-Care   Self-Care --  education    Neuro Re-ed    Neuro Re-ed Details  ways to decrease strain on  abdomen/ pelvic floor mm                 PT Education - 05/06/15 1415    Education provided Yes   Education Details POC, anatomy.physiology, goals   Person(s) Educated Patient   Methods Explanation;Demonstration;Tactile cues;Verbal cues;Handout   Comprehension Returned demonstration;Verbalized understanding             PT Long Term Goals - 05/06/15 1420    PT LONG TERM GOAL #1   Title pt will demo proper diaphramgtic breathing in order to demo pelvic floor ROM for optimal pelvic floor function.    Time 12   Period Weeks   Status New   PT LONG TERM GOAL #2   Title Pt will demo proper lifting, out of bed , and toileting mechancis to decrease strain on his pelvic floor mm.   Time 12   Period Weeks   Status New   PT LONG TERM GOAL #3   Title Pt will demo pelvic floor coordination, endurance  training, and quick contraction to minimize risk of incontinence.    Time 12   Period Weeks   Status New   PT LONG TERM GOAL #4   Title pt will demo proper sitting posture in a simulated truck seat with modifications in order to preserve health of pelvic floor while at work.    Time 12   Period Weeks   Status New               Plan - 05/06/15 1416    Clinical Impression Statement Pt is a 60 yo male who is referred to  Pelvic Health PT for pre-prostatectomy pelvic floor training. Pt's  clinical presentations and personal factors that place him at increased  risk for urinary incontinence post-surgery include: prolonged sitting at  his work with poor posture, poor coordination of pelvic floor and  abdominal mm, abdominal bulging w/ certain movements, poor body mechanics  with places strain on his deep core system, Hx of hernia and lack of  physical exercise. Pt would benefit from skilled PT before and after surgery to minimize his risk  for pelvic floor dysfunction. Pt's musculoskeletal condition is low in  complexity and stable in nature.         Pt will benefit from skilled therapeutic intervention in order to improve on the following deficits Decreased strength;Improper body mechanics;Increased muscle spasms;Decreased mobility;Decreased endurance;Increased fascial restricitons;Decreased coordination;Decreased safety awareness;Decreased range of motion;Impaired flexibility;Postural dysfunction   Rehab Potential Good   PT Frequency 1x / week   PT Duration 12 weeks   PT Treatment/Interventions ADLs/Self Care Home Management;Aquatic Therapy;Biofeedback;Electrical Stimulation;Iontophoresis 4mg /ml Dexamethasone;Moist Heat;Patient/family education;Neuromuscular re-education;Therapeutic exercise;Therapeutic activities;Stair training;Gait training;Manual techniques;Scar mobilization;Dry needling;Taping;Passive range of motion   Consulted and Agree with Plan of Care Patient;Family  member/caregiver         Problem List Patient Active Problem List   Diagnosis Date Noted  . Elevated PSA 03/01/2015  . 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 Elevated prostate specific antigen (PSA) 02/25/2015  . BP (high blood pressure) 02/25/2015  . Pure hypercholesterolemia 02/25/2015    Jerl Mina ,PT, DPT, E-RYT  05/06/2015, 2:32 PM  [place] MAIN [facility] SERVICES [address] [place], [place], [address] Phone: [phone]   Fax:  [phone]  Name: Aceyn Kohls MRN: [id number] Date of Birth: April 13, 1955

## 2015-05-13 ENCOUNTER — Ambulatory Visit: Payer: BLUE CROSS/BLUE SHIELD | Attending: Urology | Admitting: Physical Therapy

## 2015-05-13 DIAGNOSIS — R279 Unspecified lack of coordination: Secondary | ICD-10-CM | POA: Diagnosis not present

## 2015-05-13 DIAGNOSIS — R531 Weakness: Secondary | ICD-10-CM | POA: Diagnosis present

## 2015-05-13 DIAGNOSIS — M629 Disorder of muscle, unspecified: Secondary | ICD-10-CM | POA: Insufficient documentation

## 2015-05-13 DIAGNOSIS — M6289 Other specified disorders of muscle: Secondary | ICD-10-CM

## 2015-05-13 NOTE — Patient Instructions (Addendum)
PELVIC FLOOR / KEGEL EXERCISES TO PERFORM  Before Surgery and after the catheter as been removed     Pelvic floor/ Kegel exercises are used to strengthen the muscles in the base of your pelvis that are responsible for supporting your pelvic organs and preventing urine/feces leakage. Based on your therapist's recommendations, they can be performed while standing, sitting, or lying down.  Make yourself aware of this muscle group spanning in a diamond shaped area. Top of the diamond is the pubic bone, bottom of the diamond is the tailbone, and edges of the diamond is where the sitting bones are located. Use these cues to notice your ability to lengthen and lower them towards feet with inhalation (pelvic floor lowers like a hammock)  and to lift them upward with exhalation (pelvic floor domes up like an umbrella) :   Brad Spice, do nothing but feel the belly expand and pelvic floor lowering/expanding like a hammock. Exhale, feel pelvic floor lifting upwards with the scrotum as if you are walking into a cold lake.   . Inhale, do nothing, relax belly and pelvic floor. Then exhale, pull up the center of the Brad Patrick shaped area of your pelvic floor muscles inside your body while your belly/back muscles wrap tighter or "hug" around you.  Brad Patrick Kitchen Another way to practice is by place your hand on top of your pubic bone. Tighten and draw in the muscles around the anal and uretha openings. You will feel the muscles lift towards your pubic bone and squeeze the openings shut. If you are squeezing your buttock muscles too, you will need to decrease your amount of effort by 50% to not involve the buttocks.   Common Errors: . Breath holding: If you are holding your breath, you may be bearing down against your bladder instead of pulling it up. If you belly bulges up while you are squeezing, you are holding your breath. Be sure to breathe gently in and out while exercising. Counting out loud may help you avoid holding your  breath. . Accessory muscle use: You should not see or feel other muscle movement when performing pelvic floor exercises. When done properly, no one can tell that you are performing the exercises. Keep the buttocks, belly and inner thighs relaxed. . Overdoing it: Your muscles can fatigue and stop working for you if you over-exercise. You may actually leak more or feel soreness at the lower abdomen or rectum.  YOUR HOME EXERCISE PROGRAM  QUICK FLICKS:  Position: seated or on your back  . inhale, exhale. squeeze the muscle quick and hold  for one second, then let go for one second.  . Perform 5 repetitions,  5 times/day  . With sit to stand and coughing/laughing/ sneezing and lifting   LONG HOLDS: Position: on back or sidelying w/ pillow between knees . inhale, exhale then squeeze the muscle and then count aloud for 10 seconds. Rest for 10 seconds (4 long breaths)  . Perform 3 repetitions, 5 times/day    ________________  Car seat modification with towels to maintain spinal curve and minimize forward head posture

## 2015-05-14 NOTE — Therapy (Signed)
[place] MAIN [facility] SERVICES [address] [place], [place], [address] Phone: [phone]   Fax:  [phone]  Physical Therapy Treatment / Discharge Summary   Patient Details  Name: Brad Patrick MRN: [id number] Date of Birth: 1955-10-02 Referring Provider: Dr. Erlene Quan   Encounter Date: 05/13/2015      PT End of Session - 05/13/15 1412    Visit Number 2   Number of Visits 12   Date for PT Re-Evaluation 05/27/15   PT Start Time 1300   PT Stop Time [id number]   PT Time Calculation (min) 55 min   Activity Tolerance Patient tolerated treatment well;No increased pain   Behavior During Therapy [facility] for tasks assessed/performed      Past Medical History  Diagnosis Date  . Elevated PSA   . HLD (hyperlipidemia)   . HTN (hypertension)   . Prostate cancer [facility])     Past Surgical History  Procedure Laterality Date  . Hernia repair      There were no vitals filed for this visit.  Visit Diagnosis:  Lack of coordination  Weakness  Fascial defect  Pelvic floor dysfunction      Subjective Assessment - 05/13/15 1313    Subjective Pt reported he understands he will no perform pelvic floor exercises until after the catheter has been removed.    Patient is accompained by: Family member  wife   Pertinent History Daily bowel movements, denied straining.  Denied sexual dysfunction. Physical activities: Pt has not had any routine. Hx of hernia             [facility] PT Assessment - 05/14/15 1005    Bed Mobility   Bed Mobility --  no cuing for log rolling                   Pelvic Floor Special Questions - 05/14/15 1004    External Perineal Exam through pants   long holds 3 reps, 10 sec, quick supine/seated 5 x            [facility] Adult PT Treatment/Exercise - 05/14/15 1005    Self-Care   Self-Care --  pelvic floor program: precautions, expectations, progression   Therapeutic Activites    Therapeutic Activities --  towel  modifications in worktruck seat, lifting mechanics   Neuro Re-ed    Neuro Re-ed Details  pelvic floor strengthening with 4 breath rest break 3 reps at 10 sec, supine.  Seated/ supine quick 5 x each position,  quick contraction and body mechanics with lifting and sit to stand 5 reps each                      PT Long Term Goals - 05/14/15 1006    PT LONG TERM GOAL #1   Title pt will demo proper diaphragmatic breathing in order to demo pelvic floor ROM for optimal pelvic floor function.    Time 12   Period Weeks   Status Achieved   PT LONG TERM GOAL #2   Title Pt will demo proper lifting, out of bed , and toileting mechancis to decrease strain on his pelvic floor mm.   Time 12   Period Weeks   Status Achieved   PT LONG TERM GOAL #3   Title Pt will demo pelvic floor coordination, endurance training, and quick contraction to minimize risk of incontinence.    Time 12   Period Weeks   Status Achieved   PT LONG TERM  GOAL #4   Title pt will demo proper sitting posture in a simulated truck seat with modifications in order to preserve health of pelvic floor while at work.    Time 12   Period Weeks   Status Achieved               Plan - 05/14/15 1008    Clinical Impression Statement Pt has achieved 100% of his goals and demonstrated proper pelvic floor coordination/ strength training in supine/seated positions and functional activities. Pt demo'd proper body mechanics to minimize strain on the pelvic floor mm. Pt voiced understanding to start pelvic floor strengthening prior to surgery and after removal of catheter. Pt and his wife were a pleasure to work with and pt showed good carryover from previous visit. Pt is ready to be d/c at this time and may benefit from further PT post surgery if pt needs further guidance on pelvic floor rehab.     Pt will benefit from skilled therapeutic intervention in order to improve on the following deficits Decreased strength;Improper body  mechanics;Increased muscle spasms;Decreased mobility;Decreased endurance;Increased fascial restricitons;Decreased coordination;Decreased safety awareness;Decreased range of motion;Impaired flexibility;Postural dysfunction   Rehab Potential Good   PT Frequency 1x / week   PT Duration 4 weeks   PT Treatment/Interventions ADLs/Self Care Home Management;Aquatic Therapy;Biofeedback;Electrical Stimulation;Iontophoresis 4mg /ml Dexamethasone;Moist Heat;Patient/family education;Neuromuscular re-education;Therapeutic exercise;Therapeutic activities;Stair training;Gait training;Manual techniques;Scar mobilization;Dry needling;Taping;Passive range of motion   Consulted and Agree with Plan of Care Patient;Family member/caregiver        Problem List Patient Active Problem List   Diagnosis Date Noted  . Elevated PSA 03/01/2015  . 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 Elevated prostate specific antigen (PSA) 02/25/2015  . BP (high blood pressure) 02/25/2015  . Pure hypercholesterolemia 02/25/2015    Jerl Mina ,PT, DPT, E-RYT   05/14/2015, 10:13 AM  [place] MAIN [facility] SERVICES [address] [place], [place], [address] Phone: [phone]   Fax:  [phone]  Name: Brad Patrick MRN: [id number] Date of Birth: 04-10-1955

## 2015-05-17 ENCOUNTER — Inpatient Hospital Stay: Payer: BLUE CROSS/BLUE SHIELD | Admitting: Anesthesiology

## 2015-05-17 ENCOUNTER — Encounter
Admission: RE | Disposition: A | Discharge status: Home / Self Care | Payer: Self-pay | Source: Ambulatory Visit | Attending: Urology

## 2015-05-17 ENCOUNTER — Encounter: Payer: Self-pay | Admitting: *Deleted

## 2015-05-17 ENCOUNTER — Inpatient Hospital Stay
Admission: RE | Admit: 2015-05-17 | Discharge: 2015-05-18 | DRG: 707 | Disposition: A | Discharge status: Home / Self Care | Payer: BLUE CROSS/BLUE SHIELD | Source: Ambulatory Visit | Attending: Urology | Admitting: Urology

## 2015-05-17 DIAGNOSIS — Z8546 Personal history of malignant neoplasm of prostate: Secondary | ICD-10-CM | POA: Diagnosis present

## 2015-05-17 DIAGNOSIS — N401 Enlarged prostate with lower urinary tract symptoms: Secondary | ICD-10-CM | POA: Diagnosis present

## 2015-05-17 DIAGNOSIS — I10 Essential (primary) hypertension: Secondary | ICD-10-CM | POA: Diagnosis present

## 2015-05-17 DIAGNOSIS — N138 Other obstructive and reflux uropathy: Secondary | ICD-10-CM | POA: Diagnosis present

## 2015-05-17 DIAGNOSIS — C61 Malignant neoplasm of prostate: Principal | ICD-10-CM | POA: Diagnosis present

## 2015-05-17 DIAGNOSIS — E785 Hyperlipidemia, unspecified: Secondary | ICD-10-CM | POA: Diagnosis present

## 2015-05-17 HISTORY — PX: ROBOT ASSISTED LAPAROSCOPIC RADICAL PROSTATECTOMY: SHX5141

## 2015-05-17 HISTORY — PX: PELVIC LYMPH NODE DISSECTION: SHX6543

## 2015-05-17 SURGERY — ROBOTIC ASSISTED LAPAROSCOPIC RADICAL PROSTATECTOMY
Anesthesia: General | Wound class: Clean Contaminated

## 2015-05-17 MED ORDER — FENTANYL CITRATE (PF) 100 MCG/2ML IJ SOLN
INTRAMUSCULAR | Status: DC | PRN
  Administered 2015-05-17 (×2): 250 ug via INTRAVENOUS

## 2015-05-17 MED ORDER — CEFAZOLIN SODIUM-DEXTROSE 2-3 GM-% IV SOLR
2.0000 g | Freq: Once | INTRAVENOUS | Status: AC
  Administered 2015-05-17 (×2): 2 g via INTRAVENOUS

## 2015-05-17 MED ORDER — ONDANSETRON HCL 4 MG/2ML IJ SOLN
INTRAMUSCULAR | Status: DC | PRN
  Administered 2015-05-17: 4 mg via INTRAVENOUS

## 2015-05-17 MED ORDER — SUGAMMADEX SODIUM 200 MG/2ML IV SOLN
INTRAVENOUS | Status: DC | PRN
  Administered 2015-05-17: 200 mg via INTRAVENOUS

## 2015-05-17 MED ORDER — DIPHENHYDRAMINE HCL 12.5 MG/5ML PO ELIX: 12.5000 mg | ORAL_SOLUTION | Freq: Four times a day (QID) | ORAL | Status: DC | PRN

## 2015-05-17 MED ORDER — LACTATED RINGERS IV SOLN
INTRAVENOUS | Status: DC
  Administered 2015-05-17 (×2): via INTRAVENOUS

## 2015-05-17 MED ORDER — BUPIVACAINE-EPINEPHRINE (PF) 0.5% -1:200000 IJ SOLN
INTRAMUSCULAR | Status: DC | PRN
  Administered 2015-05-17: 10 mL

## 2015-05-17 MED ORDER — DIPHENHYDRAMINE HCL 50 MG/ML IJ SOLN: 12.5000 mg | Freq: Four times a day (QID) | INTRAMUSCULAR | Status: DC | PRN

## 2015-05-17 MED ORDER — OXYCODONE-ACETAMINOPHEN 5-325 MG PO TABS
1.0000 | ORAL_TABLET | ORAL | Status: DC | PRN
  Administered 2015-05-18 (×4): 2 via ORAL
  Filled 2015-05-17 (×4): qty 2

## 2015-05-17 MED ORDER — ACETAMINOPHEN 325 MG PO TABS: 650.0000 mg | ORAL_TABLET | ORAL | Status: DC | PRN

## 2015-05-17 MED ORDER — ACETAMINOPHEN 10 MG/ML IV SOLN
INTRAVENOUS | Status: AC
  Filled 2015-05-17: qty 100

## 2015-05-17 MED ORDER — ONDANSETRON HCL 4 MG/2ML IJ SOLN: 4.0000 mg | INTRAMUSCULAR | Status: DC | PRN

## 2015-05-17 MED ORDER — CEFAZOLIN SODIUM-DEXTROSE 2-3 GM-% IV SOLR
INTRAVENOUS | Status: AC
  Filled 2015-05-17: qty 50

## 2015-05-17 MED ORDER — HEPARIN SODIUM (PORCINE) 5000 UNIT/ML IJ SOLN
INTRAMUSCULAR | Status: AC
  Filled 2015-05-17: qty 1

## 2015-05-17 MED ORDER — SODIUM CHLORIDE 0.9 % IV SOLN
INTRAVENOUS | Status: DC
  Administered 2015-05-17 – 2015-05-18 (×2): via INTRAVENOUS

## 2015-05-17 MED ORDER — ONDANSETRON HCL 4 MG/2ML IJ SOLN: 4.0000 mg | Freq: Once | INTRAMUSCULAR | Status: DC | PRN

## 2015-05-17 MED ORDER — HEPARIN SODIUM (PORCINE) 5000 UNIT/ML IJ SOLN
5000.0000 [IU] | Freq: Three times a day (TID) | INTRAMUSCULAR | Status: DC
  Administered 2015-05-17 – 2015-05-18 (×4): 5000 [IU] via SUBCUTANEOUS
  Filled 2015-05-17 (×4): qty 1

## 2015-05-17 MED ORDER — MORPHINE SULFATE (PF) 2 MG/ML IV SOLN
2.0000 mg | INTRAVENOUS | Status: DC | PRN
  Administered 2015-05-17 – 2015-05-18 (×2): 2 mg via INTRAVENOUS
  Filled 2015-05-17 (×2): qty 1

## 2015-05-17 MED ORDER — DEXTROSE 5 % IV SOLN: 10.0000 mg | INTRAVENOUS | Status: DC | PRN

## 2015-05-17 MED ORDER — ROCURONIUM BROMIDE 100 MG/10ML IV SOLN
INTRAVENOUS | Status: DC | PRN
Start: 2015-05-17 — End: 2015-05-17
  Administered 2015-05-17 (×6): 25 mg via INTRAVENOUS
  Administered 2015-05-17 (×2): 50 mg via INTRAVENOUS

## 2015-05-17 MED ORDER — THROMBIN 5000 UNITS EX SOLR
CUTANEOUS | Status: DC | PRN
  Administered 2015-05-17: 5000 [IU] via TOPICAL

## 2015-05-17 MED ORDER — HYDROCHLOROTHIAZIDE 10 MG/ML ORAL SUSPENSION
6.2500 mg | Freq: Every day | ORAL | Status: DC
  Administered 2015-05-18: 6.25 mg via ORAL
  Filled 2015-05-17 (×5): qty 1.25

## 2015-05-17 MED ORDER — SODIUM CHLORIDE 0.9 % IV SOLN
INTRAVENOUS | Status: DC | PRN
  Administered 2015-05-17: 10:00:00 via INTRAVENOUS

## 2015-05-17 MED ORDER — FENTANYL CITRATE (PF) 100 MCG/2ML IJ SOLN
INTRAMUSCULAR | Status: AC
  Administered 2015-05-17: 25 ug via INTRAVENOUS
  Filled 2015-05-17: qty 2

## 2015-05-17 MED ORDER — THROMBIN 20000 UNITS EX KIT
PACK | CUTANEOUS | Status: AC
  Filled 2015-05-17: qty 1

## 2015-05-17 MED ORDER — LIDOCAINE HCL (CARDIAC) 20 MG/ML IV SOLN
INTRAVENOUS | Status: DC | PRN
  Administered 2015-05-17: 100 mg via INTRAVENOUS

## 2015-05-17 MED ORDER — PHENYLEPHRINE HCL 10 MG/ML IJ SOLN
INTRAMUSCULAR | Status: DC | PRN
  Administered 2015-05-17: 100 ug via INTRAVENOUS

## 2015-05-17 MED ORDER — CEFAZOLIN SODIUM 1-5 GM-% IV SOLN
1.0000 g | Freq: Three times a day (TID) | INTRAVENOUS | Status: AC
  Administered 2015-05-17 – 2015-05-18 (×2): 1 g via INTRAVENOUS
  Filled 2015-05-17 (×2): qty 50

## 2015-05-17 MED ORDER — HYDROMORPHONE HCL 1 MG/ML IJ SOLN
INTRAMUSCULAR | Status: DC | PRN
  Administered 2015-05-17 (×2): 1 mg via INTRAVENOUS

## 2015-05-17 MED ORDER — FENTANYL CITRATE (PF) 100 MCG/2ML IJ SOLN
25.0000 ug | INTRAMUSCULAR | Status: DC | PRN
  Administered 2015-05-17 (×3): 25 ug via INTRAVENOUS

## 2015-05-17 MED ORDER — OXYBUTYNIN CHLORIDE 5 MG PO TABS: 5.0000 mg | ORAL_TABLET | Freq: Three times a day (TID) | ORAL | Status: DC | PRN

## 2015-05-17 MED ORDER — PROPOFOL 10 MG/ML IV BOLUS
INTRAVENOUS | Status: DC | PRN
  Administered 2015-05-17: 150 mg via INTRAVENOUS

## 2015-05-17 MED ORDER — BISOPROLOL FUMARATE 5 MG PO TABS
5.0000 mg | ORAL_TABLET | Freq: Every day | ORAL | Status: DC
  Administered 2015-05-18: 5 mg via ORAL
  Filled 2015-05-17 (×2): qty 1

## 2015-05-17 MED ORDER — THROMBIN 5000 UNITS EX SOLR
CUTANEOUS | Status: AC
  Filled 2015-05-17: qty 5000

## 2015-05-17 MED ORDER — MIDAZOLAM HCL 2 MG/2ML IJ SOLN
INTRAMUSCULAR | Status: DC | PRN
  Administered 2015-05-17: 2 mg via INTRAVENOUS

## 2015-05-17 MED ORDER — BISOPROLOL-HYDROCHLOROTHIAZIDE 5-6.25 MG PO TABS: 1.0000 | ORAL_TABLET | Freq: Every day | ORAL | Status: DC

## 2015-05-17 MED ORDER — ACETAMINOPHEN 10 MG/ML IV SOLN
INTRAVENOUS | Status: DC | PRN
  Administered 2015-05-17: 1000 mg via INTRAVENOUS

## 2015-05-17 MED ORDER — FAMOTIDINE 20 MG PO TABS
20.0000 mg | ORAL_TABLET | Freq: Once | ORAL | Status: AC
  Administered 2015-05-17: 20 mg via ORAL

## 2015-05-17 MED ORDER — BUPIVACAINE-EPINEPHRINE (PF) 0.5% -1:200000 IJ SOLN
INTRAMUSCULAR | Status: AC
  Filled 2015-05-17: qty 30

## 2015-05-17 MED ORDER — SODIUM CHLORIDE 0.9 % IV BOLUS (SEPSIS)
1000.0000 mL | Freq: Once | INTRAVENOUS | Status: AC
  Administered 2015-05-17: 1000 mL via INTRAVENOUS

## 2015-05-17 MED ORDER — FAMOTIDINE 20 MG PO TABS
ORAL_TABLET | ORAL | Status: AC
  Administered 2015-05-17: 20 mg via ORAL
  Filled 2015-05-17: qty 1

## 2015-05-17 MED ORDER — BISOPROLOL FUMARATE 5 MG PO TABS
5.0000 mg | ORAL_TABLET | Freq: Once | ORAL | Status: AC
  Administered 2015-05-17: 5 mg via ORAL
  Filled 2015-05-17: qty 1

## 2015-05-17 MED ORDER — DOCUSATE SODIUM 100 MG PO CAPS
100.0000 mg | ORAL_CAPSULE | Freq: Two times a day (BID) | ORAL | Status: DC
  Administered 2015-05-17 – 2015-05-18 (×2): 100 mg via ORAL
  Filled 2015-05-17 (×3): qty 1

## 2015-05-17 SURGICAL SUPPLY — 91 items
ANCHOR TIS RET SYS 235ML (MISCELLANEOUS) ×2 IMPLANT
APPLICATOR SURGIFLO (MISCELLANEOUS) ×2 IMPLANT
APPLIER CLIP LOGIC TI 5 (MISCELLANEOUS) IMPLANT
BAG URO DRAIN 2000ML W/SPOUT (MISCELLANEOUS) ×2 IMPLANT
BLADE CLIPPER SURG (BLADE) ×2 IMPLANT
BULB RESERV EVAC DRAIN JP 100C (MISCELLANEOUS) ×2 IMPLANT
CANISTER SUCT 1200ML W/VALVE (MISCELLANEOUS) ×2 IMPLANT
CATH DRAINAGE MALECOT 26FR (CATHETERS) ×1 IMPLANT
CATH FOL 2WAY LX 16X5 (CATHETERS) ×2 IMPLANT
CATH FOL 2WAY LX 18X5 (CATHETERS) ×4 IMPLANT
CATH MALECOT (CATHETERS) ×2
CHLORAPREP W/TINT 26ML (MISCELLANEOUS) ×4 IMPLANT
CLIP LIGATING HEM O LOK PURPLE (MISCELLANEOUS) ×10 IMPLANT
CLIP SUT LAPRA TY ABSORB (SUTURE) IMPLANT
CORD BIP STRL DISP 12FT (MISCELLANEOUS) ×2 IMPLANT
COVER BACK TABLE 60X90IN (DRAPES) ×2 IMPLANT
COVER TIP SHEARS 8 DVNC (MISCELLANEOUS) ×1 IMPLANT
COVER TIP SHEARS 8MM DA VINCI (MISCELLANEOUS) ×1
DEFOGGER SCOPE WARMER CLEARIFY (MISCELLANEOUS) ×2 IMPLANT
DRAIN CHANNEL JP 15F RND 16 (MISCELLANEOUS) ×2 IMPLANT
DRAIN CHANNEL JP 19F (MISCELLANEOUS) IMPLANT
DRAPE LEGGINS SURG 28X43 STRL (DRAPES) ×2 IMPLANT
DRAPE SHEET LG 3/4 BI-LAMINATE (DRAPES) ×4 IMPLANT
DRAPE SURG 17X11 SM STRL (DRAPES) ×8 IMPLANT
DRAPE UNDER BUTTOCK W/FLU (DRAPES) ×2 IMPLANT
DRIVER LRG NEEDLE DA VINCI (INSTRUMENTS) ×2
DRIVER NDLE LRG DVNC (INSTRUMENTS) ×2 IMPLANT
DRSG TELFA 3X8 NADH (GAUZE/BANDAGES/DRESSINGS) ×2 IMPLANT
ELECT REM PT RETURN 9FT ADLT (ELECTROSURGICAL) ×2
ELECTRODE REM PT RTRN 9FT ADLT (ELECTROSURGICAL) ×1 IMPLANT
FILTER LAP SMOKE EVAC STRL (MISCELLANEOUS) ×2 IMPLANT
GLOVE BIO SURGEON STRL SZ 6.5 (GLOVE) ×16 IMPLANT
GOWN STRL REUS W/ TWL LRG LVL3 (GOWN DISPOSABLE) ×7 IMPLANT
GOWN STRL REUS W/TWL LRG LVL3 (GOWN DISPOSABLE) ×7
GRASPER SUT TROCAR 14GX15 (MISCELLANEOUS) ×2 IMPLANT
HEMOSTAT SURGICEL 2X14 (HEMOSTASIS) ×2 IMPLANT
HOLDER FOLEY CATH W/STRAP (MISCELLANEOUS) ×2 IMPLANT
IRRIGATION STRYKERFLOW (MISCELLANEOUS) ×1 IMPLANT
IRRIGATOR STRYKERFLOW (MISCELLANEOUS) ×2
IV LACTATED RINGERS 1000ML (IV SOLUTION) ×4 IMPLANT
IV NS 1000ML (IV SOLUTION)
IV NS 1000ML BAXH (IV SOLUTION) IMPLANT
KIT ACCESSORY DA VINCI DISP (KITS) ×1
KIT ACCESSORY DVNC DISP (KITS) ×1 IMPLANT
KIT PINK PAD W/HEAD ARE REST (MISCELLANEOUS) ×2
KIT PINK PAD W/HEAD ARM REST (MISCELLANEOUS) ×1 IMPLANT
LABEL OR SOLS (LABEL) ×2 IMPLANT
LIQUID BAND (GAUZE/BANDAGES/DRESSINGS) ×2 IMPLANT
MARKER SKIN DUAL TIP RULER LAB (MISCELLANEOUS) ×2 IMPLANT
NDL SAFETY 18GX1.5 (NEEDLE) ×2 IMPLANT
NEEDLE HYPO 25X1 1.5 SAFETY (NEEDLE) ×2 IMPLANT
NEEDLE INSUFFLATION 14GA 120MM (NEEDLE) ×2 IMPLANT
NS IRRIG 500ML POUR BTL (IV SOLUTION) ×2 IMPLANT
PACK LAP CHOLECYSTECTOMY (MISCELLANEOUS) ×2 IMPLANT
PENCIL ELECTRO HAND CTR (MISCELLANEOUS) ×2 IMPLANT
PROGRASP ENDOWRIST DA VINCI (INSTRUMENTS) ×1
PROGRASP ENDOWRIST DVNC (INSTRUMENTS) ×1 IMPLANT
SCISSORS METZENBAUM CVD 33 (INSTRUMENTS) ×2 IMPLANT
SLEEVE ENDOPATH XCEL 5M (ENDOMECHANICALS) ×4 IMPLANT
SOLUTION ELECTROLUBE (MISCELLANEOUS) ×2 IMPLANT
SPOGE SURGIFLO 8M (HEMOSTASIS)
SPONGE DRAIN TRACH 4X4 STRL 2S (GAUZE/BANDAGES/DRESSINGS) ×2 IMPLANT
SPONGE EXCIL AMD DRAIN 4X4 6P (MISCELLANEOUS) ×2 IMPLANT
SPONGE SURGIFLO 8M (HEMOSTASIS) IMPLANT
STAPLER SKIN PROX 35W (STAPLE) ×2 IMPLANT
STRAP SAFETY BODY (MISCELLANEOUS) ×6 IMPLANT
SUT DVC VLOC 90 3-0 CV23 UNDY (SUTURE) ×4 IMPLANT
SUT DVC VLOC 90 3-0 CV23 VLT (SUTURE) ×2
SUT ETHILON 3-0 FS-10 30 BLK (SUTURE)
SUT MNCRL 4-0 (SUTURE) ×2
SUT MNCRL 4-0 27XMFL (SUTURE) ×2
SUT PROLENE 5 0 PS 3 (SUTURE) IMPLANT
SUT SILK 2 0 SH (SUTURE) ×2 IMPLANT
SUT VIC AB 0 CT1 36 (SUTURE) ×2 IMPLANT
SUT VIC AB 2-0 CT1 (SUTURE) ×2 IMPLANT
SUT VIC AB 2-0 SH 27 (SUTURE) ×1
SUT VIC AB 2-0 SH 27XBRD (SUTURE) ×1 IMPLANT
SUT VICRYL 0 AB UR-6 (SUTURE) ×2 IMPLANT
SUTURE DVC VLC 90 3-0 CV23 VLT (SUTURE) ×1 IMPLANT
SUTURE EHLN 3-0 FS-10 30 BLK (SUTURE) IMPLANT
SUTURE MNCRL 4-0 27XMF (SUTURE) ×2 IMPLANT
SYRINGE 10CC LL (SYRINGE) ×2 IMPLANT
SYRINGE IRR TOOMEY STRL 70CC (SYRINGE) IMPLANT
TAPE CLOTH 10X20 WHT NS LF (TAPE) ×2 IMPLANT
TAPE CLOTH 2X10 WHT NS LF (TAPE) ×2
TROCAR DISP BLADELESS 8 DVNC (TROCAR) ×1 IMPLANT
TROCAR DISP BLADELESS 8MM (TROCAR) ×1
TROCAR ENDOPATH XCEL 12X100 BL (ENDOMECHANICALS) ×4 IMPLANT
TROCAR XCEL 12X100 BLDLESS (ENDOMECHANICALS) ×2 IMPLANT
TROCAR XCEL NON-BLD 5MMX100MML (ENDOMECHANICALS) ×2 IMPLANT
TUBING INSUFFLATOR HI FLOW (MISCELLANEOUS) ×2 IMPLANT

## 2015-05-17 NOTE — Progress Notes (Signed)
Pt arrived on unit. VSS.  Brad Patrick   

## 2015-05-17 NOTE — Transfer of Care (Signed)
Immediate Anesthesia Transfer of Care Note  Patient: Brad Patrick  Procedure(s) Performed: Procedure(s): ROBOTIC ASSISTED LAPAROSCOPIC RADICAL PROSTATECTOMY (N/A) PELVIC LYMPH NODE DISSECTION (N/A)  Patient Location: PACU  Anesthesia Type:General  Level of Consciousness: awake and patient cooperative  Airway & Oxygen Therapy: Patient Spontanous Breathing and Patient connected to face mask oxygen  Post-op Assessment: Report given to RN and Post -op Vital signs reviewed and stable  Post vital signs: Reviewed and stable  Last Vitals:  Filed Vitals:   05/17/15 0632 05/17/15 1320  BP: 156/90 135/87  Pulse: 85 91  Temp: 36.6 C 36.7 C  Resp: 14 16    Complications: No apparent anesthesia complications

## 2015-05-17 NOTE — Consult Note (Signed)
Pharmacy Consult note  Patients antibiotics have been reviewed by pharmacy for appropriate dosing based on renal function.  Thank you for allowing pharmacy to be a part of this patient's care.  Nancy Fetter, PharmD Pharmacy Resident  05/17/2015 2:54 PM

## 2015-05-17 NOTE — Anesthesia Preprocedure Evaluation (Signed)
Anesthesia Evaluation  Patient identified by MRN, date of birth, ID band Patient awake    Reviewed: Allergy & Precautions, NPO status , Patient's Chart, lab work & pertinent test results  History of Anesthesia Complications Negative for: history of anesthetic complications  Airway Mallampati: II       Dental  (+) Teeth Intact   Pulmonary neg pulmonary ROS,    Pulmonary exam normal        Cardiovascular Exercise Tolerance: Good hypertension, Pt. on medications  Rhythm:Regular Rate:Normal     Neuro/Psych negative neurological ROS     GI/Hepatic negative GI ROS, Neg liver ROS,   Endo/Other  negative endocrine ROS  Renal/GU      Musculoskeletal   Abdominal Normal abdominal exam  (+)   Peds  Hematology negative hematology ROS (+)   Anesthesia Other Findings   Reproductive/Obstetrics                             Anesthesia Physical Anesthesia Plan  ASA: II  Anesthesia Plan: General   Post-op Pain Management:    Induction: Intravenous  Airway Management Planned: Oral ETT  Additional Equipment:   Intra-op Plan:   Post-operative Plan: Extubation in OR  Informed Consent: I have reviewed the patients History and Physical, chart, labs and discussed the procedure including the risks, benefits and alternatives for the proposed anesthesia with the patient or authorized representative who has indicated his/her understanding and acceptance.     Plan Discussed with: CRNA  Anesthesia Plan Comments:         Anesthesia Quick Evaluation

## 2015-05-17 NOTE — Anesthesia Postprocedure Evaluation (Signed)
Anesthesia Post Note  Patient: Brad Patrick  Procedure(s) Performed: Procedure(s) (LRB): ROBOTIC ASSISTED LAPAROSCOPIC RADICAL PROSTATECTOMY (N/A) PELVIC LYMPH NODE DISSECTION (N/A)  Patient location during evaluation: PACU Anesthesia Type: General Level of consciousness: awake Pain management: pain level controlled Vital Signs Assessment: post-procedure vital signs reviewed and stable Respiratory status: spontaneous breathing Cardiovascular status: blood pressure returned to baseline Anesthetic complications: no    Last Vitals:  Filed Vitals:   05/17/15 0632 05/17/15 1320  BP: 156/90 135/87  Pulse: 85 91  Temp: 36.6 C 36.7 C  Resp: 14 16    Last Pain: There were no vitals filed for this visit.               VAN STAVEREN,Jhayden Demuro

## 2015-05-17 NOTE — Anesthesia Postprocedure Evaluation (Signed)
Anesthesia Post Note  Patient: Brad Patrick  Procedure(s) Performed: Procedure(s) (LRB): ROBOTIC ASSISTED LAPAROSCOPIC RADICAL PROSTATECTOMY (N/A) PELVIC LYMPH NODE DISSECTION (N/A)  Patient location during evaluation: PACU Anesthesia Type: General Level of consciousness: awake Pain management: pain level controlled Vital Signs Assessment: post-procedure vital signs reviewed and stable Respiratory status: spontaneous breathing Cardiovascular status: blood pressure returned to baseline Anesthetic complications: no    Last Vitals:  Filed Vitals:   05/17/15 0632 05/17/15 1320  BP: 156/90 135/87  Pulse: 85 91  Temp: 36.6 C 36.7 C  Resp: 14 16    Last Pain: There were no vitals filed for this visit.               VAN STAVEREN,Verlaine Embry

## 2015-05-17 NOTE — Op Note (Signed)
PREOPERATIVE DIAGNOSIS: Prostate cancer.   POSTOPERATIVE DIAGNOSIS: Prostate cancer.   OPERATION PERFORMED: 1. DaVinci laparoscopic radical prostatectomy (left sided nerve sparing) 2 DaVinci laproscopic bilateral pelvic lymph node dissection.   SURGEON: Hollice Espy, MD   ASSISTANT SURGEONS: Link Snuffer, PA  ANESTHESIA: General.  EBL:400 cc   SPECIMEN: Prostate with bilateral seminal vesicals, bilateral pelvic lymph nodes, anterior fat pad, bladder neck margin.   FINDINGS: Accessory Pudendal Vessel: none   INDICATION: Pt.is a 60 year old male with high-risk Gleason 4+3 prostate cancer, PSA 33. Treatment options were discussed with himat length and he chose DaVinci radical prostatectomy.  Given the very high volume of diease, plan for non-nerve sparing procedure. Bilateral pelvic lymph node dissection was planned due to his risk stratification.  PROCEDURE IN DETAIL: Patient was given Ancef preoperatively. He had sequential compression devices applied preoperatively for DVT prophylaxis. He was taken to the operating room where he was induced with general anesthesia. After adequate anesthesia, he was placed in the dorsal lithotomy position. A preoperative rectal exam was performed . His arms were draped by his side and was appropriately padded and secured to the operating room table. He was placed in the Trendelenburg position.  He was prepped and draped in sterile fashion. An 45 French Foley was placed in the bladder and instilled with 15 cc sterile water. Orogastric tube was placed. The Veress needle was passed just above the umbilicus and the abdomen was insufflated to 15 atmospheres. A 12 mm, blunt-tip trocar was placed just above the umbilicus. The zero-degree camera was passed within this and the following trocars were placed under direct vision; 8 mm robotic trocars were placed 9 cm laterally and inferiorly to the initially placed umbilical trocar. A  third one was placed 7 cm lateral to the left-sided trocar. In the corresponding position on the right side, a 12 mm trocar was placed, and then a 5 mm trocar was placed to the right and well above the umbilicus.  The 12 mm lateral port was pre-closed using a carter thomason with a 0-0 vicryl suture which was tied down a the end of the procedure.  The robot was then docked with the robot trocar. I used the zero-degree camera. I had the hot scissors in the right hand and the left hand with the Wisconsin bipolar and far left hand the Prograsp forceps. Initially I divided the median umbilical ligament bilaterally and the urachus and developed the space of Retzius down to pubic bone. I divided the parietal peritoneum laterally up to the vas deferens on each side. I used the Cardier forceps to provide cranial traction on the urachus. I cleaned off the Endopelvic fascia on each side and then divided it with the scissors laterally to the perirectal fat and medially to the puboprostatic ligaments which were divided. I then ligated the dorsal vein complex with a 0-0 Vicryl suture in a figure of eight fashion.  I then addressed the bladder neck with a 30-degree down lens. I identified the bladder neck by pulling on the Foley catheter. I divided the anterior bladder neck musculature until I then found the anterior bladder neck mucosa which was incised. I identified the Foley catheter within, deflated the balloon, pulled the Foley out through this opening and then using the Carter-Thomason needle with a #0-Vicryl suture, passed through The suprapubic region and pulled the suture through the eye of the Foley and then back out. This allowed me to provide upward traction on the prostate. I then divided the lateral  bladder neck mucosa and the posterior bladder neck mucosa. I was well away from ureteral orifices. I divided the posterior bladder neck musculature until I identified the vas deferens.  I  did sent a small piece of bladder neck margin on the left posterior bladder neck for both frozen (negative) and for permament specimen.  The SV were freed proximally, then divided. I freed up the seminal vesicals using blunt and sharp dissection. Very judicious bipolar electrocautery was used at the vessels near the tip of the seminal vesicles.  I then went back to the 0-degree lens. I divided the Denonvilliers fascia beneath the prostate and developed the prostate off the rectum. I then did non nerve sparing by dividing the lateral pelvic fascia dissecting off the prostate capsule widely bilaterally. I then isolated the pedicles of the prostate and placed weck clips on the pedicles of the prostate and then divided it with cold scissors. I continued to divide the neurovascular bundles off the prostate out to the apex of the Prostate preserving the veil on the left. At this point the prostate was freed up except for the urethra. I addressed the prostate anteriorly, divided the dorsal vein , then the anterior urethral wall, pulled the Foley catheter back and then divided the posterior urethral wall. Specimen was completely freed up. I placed the prostate in an Endo catch bag and then placed the bag in the upper abdomen out of the way. I then irrigated the pelvis. The rectal test was negative. There was reasonable hemostasis.  I then did the pelvic lymph node dissection by incising the fascia overlying the right external iliac vein, dissecting distally. I went just distal to the node of [place]oquet where we placed clips and then divided the lymphatics. The lateral aspect of the dissection was the pelvic side wall, inferior was the obturator nerve and proximal the hypogastric vessels. I placed clips at the proximal aspect and then divided the lymphatics. This was removed with the spoon grasper and sent to pathology. Grossly there were no enlarged lymph nodes.  I then did the left obturator  lymph node dissection in the same fashion as the left side at which time the nodes were noted to be tachy and mildly enlarged.  Surgicel was used to achieve additional hemostasis.    With good hemostasis, I then did the posterior reconstruction. This was achieved using a single barbed suture reapproximating the periurethral fascia to the cut edge of the tendon obvious fascia for a Rocco layer.   I then did the urethral vesicle anastomosis again with two 3-0 VLoc sutures on an RB1 needle interlocked. I passed both ends of the suture from the outside-in through the bladder neck at the 6 o'clock position. I passed both through the urethral stump from the inside-out in the corresponding position. I reapproximated the bladder neck to the urethra. I then ran the Left suture on the left side anastomosis to the 9 o'clock position. Then I went back to the right sided suture and ran that up the right side to the 12 o'clock position. I then continued the left suture to the 12 o'clock position. Patient was given indigo carmine prior to this and there was good efflux in both ureteral orifices. The suture was then suspended anteriorly behind the pubic bone.   I then placed a new 38 French Foley into the bladder and filled it with 15 cc sterile water. I irrigated the bladder with 160 cc. There was no leakage. There was reasonable hemostasis.  The instruments were then removed. The robot was undocked and all the trocars were removed under direct vision. There was good hemostasis. I then enlarged the umbilical trocar site large enough to remove the prostate and I closed the fascia here with #0-PDS sutures in a running fashion. All the port sites were irrigated. Local was injected into all the trocar sites. The skin was closed with 4-0 Monocryl in running subcuticular fashion. Surgiseal was applied.   At this point patient was awakened and extubated in the operating room and taken to the recovery  room in stable condition. There were no complications. All counts correct.  Hollice Espy, MD

## 2015-05-17 NOTE — Progress Notes (Signed)
Pt ambulated to the bathroom and did very well. Will continue to encourage early ambulation.   Brad Patrick

## 2015-05-17 NOTE — Anesthesia Procedure Notes (Signed)
Procedure Name: Intubation Date/Time: 05/17/2015 7:38 AM Performed by: Rosaria Ferries, Chesni Vos Pre-anesthesia Checklist: Patient identified, Emergency Drugs available, Suction available and Patient being monitored Patient Re-evaluated:Patient Re-evaluated prior to inductionOxygen Delivery Method: Circle system utilized Preoxygenation: Pre-oxygenation with 100% oxygen Intubation Type: IV induction Laryngoscope Size: Mac and 3 Grade View: Grade I Tube type: Oral Number of attempts: 1 Placement Confirmation: ETT inserted through vocal cords under direct vision,  positive ETCO2 and breath sounds checked- equal and bilateral Secured at: 23 cm Tube secured with: Tape Dental Injury: Teeth and Oropharynx as per pre-operative assessment

## 2015-05-17 NOTE — Progress Notes (Signed)
Scrotum very swollen

## 2015-05-17 NOTE — H&P (Signed)
Updated 05/17/15  Charna Busman 05-17-1955 US:[id number]  Referring provider: [name], MD [facility] [place], [place] [address]  Chief Complaint  Patient presents with  . Prostate Cancer  . Results    HPI: 60 yo M with newly dx prostate cancer, Gleason 3+3, 3+4 involving all cores up tp 95% of tissue (highest volume at the base). His PSA 33.2 ng/ dL at the time of biopsy on 03/31/15. Prostate exam unremarkable. TRUS vol 39cc.   Staging with CT abd/ pelvis and bone scan 04/11/15 negative.   LIH (open) repair in college.   No baseline voiding difficulties or erectile dysfunction. IPSS and SHIM below.  He returns to the office today to discuss his options for treatment.      SHIM     03/01/15 1416     SHIM: Over the last 6 months:   How do you rate your confidence that you could get and keep an erection? Very High     When you had erections with sexual stimulation, how often were your erections hard enough for penetration (entering your partner)? Almost Always or Always     During sexual intercourse, how often were you able to maintain your erection after you had penetrated (entered) your partner? Not Difficult     During sexual intercourse, how difficult was it to maintain your erection to completion of intercourse? Not Difficult     When you attempted sexual intercourse, how often was it satisfactory for you? Not Difficult     SHIM Total Score   SHIM 25            IPSS     03/01/15 1400     International Prostate Symptom Score   How often have you had the sensation of not emptying your bladder? Not at All     How often have you had to urinate less than every two hours? Less than 1 in 5 times     How often have you found you stopped and started again several times when you urinated? Less than 1 in 5 times     How often have you found it difficult to  postpone urination? Less than 1 in 5 times     How often have you had a weak urinary stream? Not at All     How often have you had to strain to start urination? Not at All     How many times did you typically get up at night to urinate? 1 Time     Total IPSS Score 4     Quality of Life due to urinary symptoms   If you were to spend the rest of your life with your urinary condition just the way it is now how would you feel about that? Pleased          PMH: Past Medical History  Diagnosis Date  . Elevated PSA   . HLD (hyperlipidemia)   . HTN (hypertension)     Surgical History: Past Surgical History  Procedure Laterality Date  . Hernia repair      Home Medications:    Medication List    Notice  As of 04/12/2015 4:54 PM   You have not been prescribed any medications.      Allergies: No Known Allergies  Family History: Family History  Problem Relation Age of Onset  . Kidney disease Neg Hx   . Prostate cancer Neg Hx     Social History:  reports that he has never smoked. He does not have any smokeless tobacco history on file. He reports that he drinks alcohol. He reports that he does not use illicit drugs.  ROS: UROLOGY Frequent Urination?: No Hard to postpone urination?: No Burning/pain with urination?: No Get up at night to urinate?: No Leakage of urine?: No Urine stream starts and stops?: No Trouble starting stream?: No Do you have to strain to urinate?: No Blood in urine?: No Urinary tract infection?: No Sexually transmitted disease?: No Injury to kidneys or bladder?: No Painful intercourse?: No Weak stream?: No Erection problems?: No Penile pain?: No  Gastrointestinal Nausea?: No Vomiting?: No Indigestion/heartburn?: No Diarrhea?: No Constipation?: No  Constitutional Fever: No Night sweats?: No Weight loss?: No Fatigue?: No  Skin Skin rash/lesions?: No Itching?:  No  Eyes Blurred vision?: No Double vision?: No  Ears/Nose/Throat Sore throat?: No Sinus problems?: No  Hematologic/Lymphatic Swollen glands?: No Easy bruising?: No  Cardiovascular Leg swelling?: No Chest pain?: No  Respiratory Cough?: No Shortness of breath?: No  Endocrine Excessive thirst?: No  Musculoskeletal Back pain?: No Joint pain?: No  Neurological Headaches?: No Dizziness?: No  Psychologic Depression?: No Anxiety?: No  Physical Exam: BP 192/106 mmHg  Pulse 109  Ht 5\' 11"  (1.803 m)  Wt 216 lb 4.8 oz (98.113 kg)  BMI 30.18 kg/m2  Constitutional: Alert and oriented, No acute distress. HEENT: [place] AT, moist mucus membranes. Trachea midline, no masses. Cardiovascular: No clubbing, cyanosis, or edema. CTAB. Respiratory: Normal respiratory effort, no increased work of breathing. RRR. Skin: No rashes, bruises or suspicious lesions. Lymph: No cervical or inguinal adenopathy. Neurologic: Grossly intact, no focal deficits, moving all 4 extremities. Psychiatric: Normal mood and affect.  Laboratory Data: PSA as above  Pertinent Imaging: Study Result     CLINICAL DATA: New diagnosis of prostate malignancy, high Gleason score, elevated PSA, no current skeletal symptoms.  EXAM: NUCLEAR MEDICINE WHOLE BODY BONE SCAN  TECHNIQUE: Whole body anterior and posterior images were obtained approximately 3 hours after intravenous injection of radiopharmaceutical.  RADIOPHARMACEUTICALS: 23.587 mCi Technetium-31m MDP IV  COMPARISON: None in PACs  FINDINGS: There is adequate uptake of the radiopharmaceutical by the skeleton. Adequate soft tissue clearance and renal activity is present.  There is a small focus of increased uptake to the right of midline in the cervical spine that is likely degenerative. Elsewhere spinal uptake is normal. Activity within the ribs, sternum, and pectoral girdle is within the limits of normal for age. There  are degenerative changes involving both wrists. Uptake within the pelvis and lower extremities is also within the limits of normal with exception of degenerative degenerative type uptake in the medial aspect of the right ankle.  IMPRESSION: There are no findings suspicious for metastatic disease.   Electronically Signed  By: David Martinique M.D.  On: 04/11/2015 13:10     Study Result     CLINICAL DATA: Elevated PSA, new diagnosis of prostate cancer.  EXAM: CT ABDOMEN AND PELVIS WITH CONTRAST  TECHNIQUE: Multidetector CT imaging of the abdomen and pelvis was performed using the standard protocol following bolus administration of intravenous contrast.  CONTRAST: 146mL OMNIPAQUE IOHEXOL 300 MG/ML SOLN  COMPARISON: None.  FINDINGS: Lower chest: Lung bases show no acute findings. Heart size normal. No pericardial or pleural effusion.  Hepatobiliary: Scattered low attenuation lesions in the liver are sub cm in size and too small to characterize. Liver may be slightly decreased in attenuation diffusely. Liver and gallbladder are otherwise unremarkable. No biliary ductal dilatation.  Pancreas: Negative.  Spleen: Negative.  Adrenals/Urinary Tract: Adrenal glands and kidneys are unremarkable. Ureters are decompressed. Bladder appears slightly thick-walled but is somewhat under distended.  Stomach/Bowel: Stomach is unremarkable. There appears to be nodular thickening along the anterior wall of the proximal duodenum (series 2, image 25 and series 4, image 8), measuring approximately 1.2 x 1.5 cm (approximately 2 cm from the pylorus). Small bowel and colon are otherwise unremarkable. Appendix is not readily visualized.  Vascular/Lymphatic: Minimal atherosclerotic calcification of the arterial vasculature. No pathologically enlarged lymph nodes.  Reproductive: Prostate is normal in size.  Other: No free fluid. Mesenteries and peritoneum are  unremarkable.  Musculoskeletal: No worrisome lytic or sclerotic lesions.  IMPRESSION: 1. No evidence of metastatic disease. 2. Possible nodular lesion along the proximal duodenal wall. Consider endoscopic evaluation, as clinically indicated, as malignancy cannot be excluded. These results will be called to the ordering clinician or representative by the Radiologist Assistant, and communication documented in the PACS or zVision Dashboard. 3. Liver may be slightly fatty.   Electronically Signed  By: Lorin Picket M.D.  On: 04/11/2015 10:32    Assessment & Plan:   1. Prostate cancer [facility]) 60 yo M with newly dx high volume cT1c Gleason 3+4 prostate cancer (high risk based on PSA) with negative metastatic work up.  The patient was counseled about the natural history of prostate cancer and the standard treatment options that are available for prostate cancer. It was explained to him how his age and life expectancy, clinical stage, Gleason score, and PSA affect his prognosis, the decision to proceed with additional staging studies, as well as how that information influences recommended treatment strategies. We discussed the roles for active surveillance, radiation therapy, surgical therapy, androgen deprivation, as well as ablative therapy options for the treatment of prostate cancer as appropriate to his individual cancer situation. We discussed the risks and benefits of these options with regard to their impact on cancer control and also in terms of potential adverse events, complications, and impact on quality of life particularly related to urinary, bowel, and sexual function. The patient was encouraged to ask questions throughout the discussion today and all questions were answered to his stated satisfaction. In addition, the patient was provided with and/or directed to appropriate resources and literature for further education about prostate cancer treatment options.  We discussed  surgical therapy for prostate cancer including the different available surgical approaches. We discussed, in detail, the risks and expectations of surgery with regard to cancer control, urinary control, and erectile dysfunction as well as expected post operative cover he processed. Additional risks of surgery including but not limalited to bleeding, infection, hernia formation, nerve damage, steel formation, bowel/rect injury, potentially necessitating colostomy, damage to the urinary tract resulting in urinary leakage, urethral stricture, and cardiopulmonary risk such as myocardial infarction, stroke, death, thromboembolism etc. were explained. The risk of open surgical conversion for robotics/laparoscopic prostatectomy is also discussed.  Given his young age, high volume disease, and high risk of recurrence based on MSK nomogram, I would recommend aggressive multimodal therapy starting with non-nerve sparing prostatectomy with bilateral lymph node disection for local control. With this approach, he may also need adjuvant radiation based on surgical pathology given high risk of seminal vesicle involvement or for biochemical recurrence down the road.   We discussed other alternatives including radiation therapy with androgen deprivation as a primary treatment. He will be referred to radiation oncology for another opinion.   I also offered him a second opinion which  she declined at this time.  He will call our office moving forward to let us know how he'd like to proceed. If it like to be booked for surgery, we will have him see physical therapy for pelvic floor Kegel exercise teaching prior to surgery per our protocol.  - Ambulatory referral to Radiation Oncology  2. Essential hypertension Profoundly hypertensive today in the office as he has been on several occasions. He reports that this is likely related to stress. No dizziness, headaches, or any other symptoms associated. I have advised him to  follow up with his PCP in the near future to address his blood pressure.  Hollice Espy, MD  [place] [address], [place], [place] [address] [phone]  I spent 45 min with this patient of which greater than 50% was spent in counseling and coordination of care with the patient

## 2015-05-18 LAB — CBC
HCT: 33.2 % — ABNORMAL LOW (ref 40.0–52.0)
HEMOGLOBIN: 11.5 g/dL — AB (ref 13.0–18.0)
MCH: 29.3 pg (ref 26.0–34.0)
MCHC: 34.6 g/dL (ref 32.0–36.0)
MCV: 84.5 fL (ref 80.0–100.0)
PLATELETS: 135 10*3/uL — AB (ref 150–440)
RBC: 3.93 MIL/uL — AB (ref 4.40–5.90)
RDW: 13.1 % (ref 11.5–14.5)
WBC: 6.1 10*3/uL (ref 3.8–10.6)

## 2015-05-18 LAB — HEMOGLOBIN AND HEMATOCRIT, BLOOD
HEMATOCRIT: 38.2 % — AB (ref 40.0–52.0)
HEMOGLOBIN: 13.2 g/dL (ref 13.0–18.0)

## 2015-05-18 MED ORDER — OXYCODONE-ACETAMINOPHEN 5-325 MG PO TABS: 1.0000 | ORAL_TABLET | ORAL | Status: DC | PRN

## 2015-05-18 MED ORDER — DOCUSATE SODIUM 100 MG PO CAPS: 100.0000 mg | ORAL_CAPSULE | Freq: Two times a day (BID) | ORAL | Status: DC

## 2015-05-18 NOTE — Progress Notes (Signed)
1 Day Post-Op Subjective: The patient is doing well.  No nausea or vomiting. Pain is adequately controlled. JP outpt 210.    Objective: Vital signs in last 24 hours: Temp:  [97.5 F (36.4 C)-99.9 F (37.7 C)] 98.7 F (37.1 C) (03/08 0520) Pulse Rate:  [85-100] 91 (03/08 0520) Resp:  [10-23] 17 (03/08 0520) BP: (101-135)/(57-87) 107/57 mmHg (03/08 0520) SpO2:  [88 %-100 %] 98 % (03/08 0520) Weight:  [229 lb 4.8 oz (104.01 kg)] 229 lb 4.8 oz (104.01 kg) (03/07 1521)  Intake/Output from previous day: 03/07 0701 - 03/08 0700 In: 5931 [P.O.:960; I.V.:4921; IV Piggyback:50] Out: 3410 [Urine:2800; Drains:210; Blood:400] Intake/Output this shift:    Physical Exam:  General: Alert and oriented. CV: RRR Lungs: Clear bilaterally. GI: Soft, Nondistended.  JP with serosanguinous fluid.   Incisions: Clean and dry. Urine: Clear, Foley in place Extremities: Nontender, no erythema, no edema.  Lab Results:  Recent Labs  05/18/15 0615  HGB 11.5*  HCT 33.2*         No results for input(s): CREATININE in the last 168 hours.         Results for orders placed or performed during the hospital encounter of 05/17/15 (from the past 24 hour(s))  CBC     Status: Abnormal   Collection Time: 05/18/15  6:15 AM  Result Value Ref Range   WBC 6.1 3.8 - 10.6 K/uL   RBC 3.93 (L) 4.40 - 5.90 MIL/uL   Hemoglobin 11.5 (L) 13.0 - 18.0 g/dL   HCT 33.2 (L) 40.0 - 52.0 %   MCV 84.5 80.0 - 100.0 fL   MCH 29.3 26.0 - 34.0 pg   MCHC 34.6 32.0 - 36.0 g/dL   RDW 13.1 11.5 - 14.5 %   Platelets 135 (L) 150 - 440 K/uL    Assessment/Plan: POD# 1 s/p robotic radical prostatectomy  1) Ambulate, Incentive spirometry 2) Advance diet as tolerated 3) Transition to oral pain medication 4) Monitor drain output 5) Repeat Hct later today, if stable d/c drain and home     Hollice Espy, MD   LOS: 1 day   Hollice Espy 05/18/2015, 8:10 AM

## 2015-05-18 NOTE — Discharge Summary (Signed)
Date of admission: 05/17/2015  Date of discharge: 05/18/2015  Admission diagnosis: Prostate cancer  Discharge diagnosis: prostate cancer  Secondary diagnoses:  Patient Active Problem List   Diagnosis Date Noted  . Prostate cancer ([place]) 05/17/2015  . Elevated PSA 03/01/2015  . 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 Elevated prostate specific antigen (PSA) 02/25/2015  . BP (high blood pressure) 02/25/2015  . Pure hypercholesterolemia 02/25/2015    History and Physical: For full details, please see admission history and physical. Briefly, Brad Patrick is a 60 y.o. year old patient with prostate cancer s/p radical robotic prostatectomy with bilateral pelvic lymph node disection.   Hospital Course: Patient tolerated the procedure well.  He was then transferred to the floor after an uneventful PACU stay.  His hospital course was uncomplicated.  On POD#1 he had met discharge criteria: was eating a regular diet, was up and ambulating independently,  pain was well controlled, and was ready to for discharge.  Catheter teaching was performed prior to discharge.     Laboratory values:   Recent Labs  05/18/15 0615 05/18/15 1531  WBC 6.1  --   HGB 11.5* 13.2  HCT 33.2* 38.2*   No results for input(s): NA, K, CL, CO2, GLUCOSE, BUN, CREATININE, CALCIUM in the last 72 hours. No results for input(s): LABPT, INR in the last 72 hours. No results for input(s): LABURIN in the last 72 hours. Results for orders placed or performed during the hospital encounter of 05/05/15  Urine culture     Status: None   Collection Time: 05/05/15  9:01 AM  Result Value Ref Range Status   Specimen Description URINE, RANDOM  Final   Special Requests NONE  Final   Culture NO GROWTH 1 DAY  Final   Report Status 05/06/2015 FINAL  Final    Disposition: Home  Discharge instruction: The patient was instructed to be ambulatory but told to refrain from heavy lifting, strenuous activity, or  driving.   Discharge medications:    Medication List    TAKE these medications        bisoprolol-hydrochlorothiazide 5-6.25 MG tablet  Commonly known as:  ZIAC  Take 1 tablet by mouth daily.     docusate sodium 100 MG capsule  Commonly known as:  COLACE  Take 1 capsule (100 mg total) by mouth 2 (two) times daily.     oxyCODONE-acetaminophen 5-325 MG tablet  Commonly known as:  PERCOCET  Take 1-2 tablets by mouth every 4 (four) hours as needed for moderate pain or severe pain.        Followup:  Follow-up Information    Follow up with Hollice Espy, MD In 1 week.   Specialty:  Urology   Why:  catheter removal/ pathology review   Contact information:   [address] [place] [address] [phone]

## 2015-05-19 LAB — SURGICAL PATHOLOGY

## 2015-05-20 ENCOUNTER — Telehealth: Payer: Self-pay

## 2015-05-20 ENCOUNTER — Telehealth: Payer: Self-pay | Admitting: Urology

## 2015-05-20 NOTE — Telephone Encounter (Signed)
Spoke with patient's wife and she states that patient has not had a bowel movement since Monday, he had surgery on Tuesday and has been taking Colace daily. Patient is having a lot of gas sometimes followed by clear liquid. Patient's wife states that she has listened and he has good bowel sounds but his belly is distended.  She was instructed for him to get Ducal ax and a fleets enema and drink plenty of water. She is to call back in a few hours if he still cannot have a bowel movement.

## 2015-05-20 NOTE — Telephone Encounter (Signed)
Patient's wife calling with questions about her husband's surgery (05/17/2015).

## 2015-05-20 NOTE — Telephone Encounter (Signed)
Spoke with patient's wife and instructed to try mag citrate if the Ducal ax doesn't work, not to use an enema again so as not to have to much manipulation in the rectum. It was also explained that after this type of surgery it can sometimes take a while for bowels to regulate. Patient's wife verbalized understanding.

## 2015-05-21 ENCOUNTER — Emergency Department: Payer: BLUE CROSS/BLUE SHIELD

## 2015-05-21 ENCOUNTER — Emergency Department
Admission: EM | Admit: 2015-05-21 | Discharge: 2015-05-21 | Disposition: A | Discharge status: Home / Self Care | Payer: BLUE CROSS/BLUE SHIELD | Attending: Emergency Medicine | Admitting: Emergency Medicine

## 2015-05-21 DIAGNOSIS — K567 Ileus, unspecified: Secondary | ICD-10-CM | POA: Diagnosis not present

## 2015-05-21 DIAGNOSIS — G8918 Other acute postprocedural pain: Secondary | ICD-10-CM | POA: Diagnosis present

## 2015-05-21 DIAGNOSIS — N5089 Other specified disorders of the male genital organs: Secondary | ICD-10-CM | POA: Insufficient documentation

## 2015-05-21 DIAGNOSIS — R600 Localized edema: Secondary | ICD-10-CM | POA: Diagnosis not present

## 2015-05-21 DIAGNOSIS — I10 Essential (primary) hypertension: Secondary | ICD-10-CM | POA: Diagnosis not present

## 2015-05-21 DIAGNOSIS — Z79899 Other long term (current) drug therapy: Secondary | ICD-10-CM | POA: Diagnosis not present

## 2015-05-21 DIAGNOSIS — R609 Edema, unspecified: Secondary | ICD-10-CM

## 2015-05-21 DIAGNOSIS — N99840 Postprocedural hematoma of a genitourinary system organ or structure following a genitourinary system procedure: Secondary | ICD-10-CM | POA: Insufficient documentation

## 2015-05-21 LAB — COMPREHENSIVE METABOLIC PANEL
ALBUMIN: 3.5 g/dL (ref 3.5–5.0)
ALK PHOS: 37 U/L — AB (ref 38–126)
ALT: 33 U/L (ref 17–63)
ANION GAP: 5 (ref 5–15)
AST: 33 U/L (ref 15–41)
BILIRUBIN TOTAL: 1.1 mg/dL (ref 0.3–1.2)
BUN: 7 mg/dL (ref 6–20)
CALCIUM: 8.2 mg/dL — AB (ref 8.9–10.3)
CO2: 27 mmol/L (ref 22–32)
Chloride: 102 mmol/L (ref 101–111)
Creatinine, Ser: 0.73 mg/dL (ref 0.61–1.24)
GFR calc Af Amer: >60 mL/min (ref >60)
GFR calc non Af Amer: >60 mL/min (ref >60)
GLUCOSE: 120 mg/dL — AB (ref 65–99)
Potassium: 2.7 mmol/L — CL (ref 3.5–5.1)
SODIUM: 134 mmol/L — AB (ref 135–145)
Total Protein: 6.7 g/dL (ref 6.5–8.1)

## 2015-05-21 LAB — CBC WITH DIFFERENTIAL/PLATELET
BASOS ABS: 0 10*3/uL (ref 0–0.1)
Basophils Relative: 0 %
EOS PCT: 2 %
Eosinophils Absolute: 0.1 10*3/uL (ref 0–0.7)
HEMATOCRIT: 36.8 % — AB (ref 40.0–52.0)
HEMOGLOBIN: 13 g/dL (ref 13.0–18.0)
LYMPHS ABS: 0.6 10*3/uL — AB (ref 1.0–3.6)
LYMPHS PCT: 10 %
MCH: 29.4 pg (ref 26.0–34.0)
MCHC: 35.4 g/dL (ref 32.0–36.0)
MCV: 83.1 fL (ref 80.0–100.0)
MONO ABS: 0.6 10*3/uL (ref 0.2–1.0)
MONOS PCT: 10 %
Neutro Abs: 4.9 10*3/uL (ref 1.4–6.5)
Neutrophils Relative %: 78 %
Platelets: 227 10*3/uL (ref 150–440)
RBC: 4.43 MIL/uL (ref 4.40–5.90)
RDW: 13 % (ref 11.5–14.5)
WBC: 6.2 10*3/uL (ref 3.8–10.6)

## 2015-05-21 LAB — TYPE AND SCREEN
ABO/RH(D): A POS
Antibody Screen: NEGATIVE

## 2015-05-21 LAB — LIPASE, BLOOD: Lipase: 20 U/L (ref 11–51)

## 2015-05-21 MED ORDER — IOHEXOL 240 MG/ML SOLN
25.0000 mL | INTRAMUSCULAR | Status: AC
  Administered 2015-05-21: 25 mL via ORAL

## 2015-05-21 MED ORDER — IOHEXOL 300 MG/ML  SOLN
100.0000 mL | Freq: Once | INTRAMUSCULAR | Status: AC | PRN
  Administered 2015-05-21: 100 mL via INTRAVENOUS

## 2015-05-21 MED ORDER — POLYETHYLENE GLYCOL 3350 17 G PO PACK: 17.0000 g | PACK | Freq: Every day | ORAL | Status: DC

## 2015-05-21 MED ORDER — SODIUM CHLORIDE 0.9 % IV BOLUS (SEPSIS)
500.0000 mL | Freq: Once | INTRAVENOUS | Status: AC
  Administered 2015-05-21: 500 mL via INTRAVENOUS

## 2015-05-21 NOTE — ED Notes (Signed)
abd dressing changed, previous dressing soaked in serosanguinous  wound drainage

## 2015-05-21 NOTE — ED Provider Notes (Addendum)
[facility] Emergency Department Provider Note  ____________________________________________  Time seen: Seen upon arrival to the emergency department  I have reviewed the triage vital signs and the nursing notes.   HISTORY  Chief Complaint Post-op Problem    HPI Brad Patrick is a 60 y.o. male with a recent prostatectomy this past Tuesday with Dr. Erlene Quan who presents today with swelling in his scrotum as well as abdominal pain and distention. He says that the swelling started yesterday and has increased overnight. He says he is also noted increased swelling to his bilateral ankles. He says that the pain is improved to his abdomen but he says the distention has not. He says that he also has drainage from his left lower quadrant former drain site where he is having cloudy fluid draining. He denies any pain to the scrotum. Says that he also has not had a bowel movement since this past Monday.   Past Medical History  Diagnosis Date  . Elevated PSA   . HLD (hyperlipidemia)   . HTN (hypertension)   . Prostate cancer [facility])     Patient Active Problem List   Diagnosis Date Noted  . Prostate cancer (Antoine) 05/17/2015  . Elevated PSA 03/01/2015  . 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 Elevated prostate specific antigen (PSA) 02/25/2015  . BP (high blood pressure) 02/25/2015  . Pure hypercholesterolemia 02/25/2015    Past Surgical History  Procedure Laterality Date  . Hernia repair    . Robot assisted laparoscopic radical prostatectomy N/A 05/17/2015    Procedure: ROBOTIC ASSISTED LAPAROSCOPIC RADICAL PROSTATECTOMY;  Surgeon: Hollice Espy, MD;  Location: [facility] ORS;  Service: Urology;  Laterality: N/A;  . Pelvic lymph node dissection N/A 05/17/2015    Procedure: PELVIC LYMPH NODE DISSECTION;  Surgeon: Hollice Espy, MD;  Location: [facility] ORS;  Service: Urology;  Laterality: N/A;    Current Outpatient Rx  Name  Route  Sig  Dispense   Refill  . bisoprolol-hydrochlorothiazide (ZIAC) 5-6.25 MG tablet   Oral   Take 1 tablet by mouth daily.          [name] docusate sodium (COLACE) 100 MG capsule   Oral   Take 1 capsule (100 mg total) by mouth 2 (two) times daily.   60 capsule   0   . oxyCODONE-acetaminophen (PERCOCET) 5-325 MG tablet   Oral   Take 1-2 tablets by mouth every 4 (four) hours as needed for moderate pain or severe pain.   20 tablet   0     Allergies Review of patient's allergies indicates no known allergies.  Family History  Problem Relation Age of Onset  . Kidney disease Neg Hx   . Prostate cancer Neg Hx     Social History Social History  Substance Use Topics  . Smoking status: Never Smoker   . Smokeless tobacco: Current User    Types: Snuff  . Alcohol Use: 3.6 oz/week    0 Standard drinks or equivalent, 6 Cans of beer per week     Comment: moderate 6 per day    Review of Systems Constitutional: No fever/chills Eyes: No visual changes. ENT: No sore throat. Cardiovascular: Denies chest pain. Respiratory: Denies shortness of breath. Gastrointestinal:   No nausea, no vomiting.  No diarrhea.   Genitourinary: Negative for dysuria. Musculoskeletal: Negative for back pain. Skin: Negative for rash. Neurological: Negative for headaches, focal weakness or numbness.  10-point ROS otherwise negative.  ____________________________________________   PHYSICAL EXAM:  VITAL SIGNS: ED  Triage Vitals  Enc Vitals Group     BP 05/21/15 1348 172/100 mmHg     Pulse Rate 05/21/15 1350 86     Resp --      Temp 05/21/15 1351 98.9 F (37.2 C)     Temp src --      SpO2 05/21/15 1350 97 %     Weight 05/21/15 1351 215 lb (97.523 kg)     Height 05/21/15 1351 5\' 11"  (1.803 m)     Head Cir --      Peak Flow --      Pain Score 05/21/15 1353 3     Pain Loc --      Pain Edu? --      Excl. in [place]? --     Constitutional: Alert and oriented. Well appearing and in no acute distress. Eyes: Conjunctivae  are normal. PERRL. EOMI. Head: Atraumatic. Nose: No congestion/rhinnorhea. Mouth/Throat: Mucous membranes are moist.  Oropharynx non-erythematous. Neck: No stridor.   Cardiovascular: Normal rate, regular rhythm. Grossly normal heart sounds.  Good peripheral circulation. Respiratory: Normal respiratory effort.  No retractions. Lungs CTAB. Gastrointestinal: Soft with diffuse tenderness palpation. Patchy spots of ecchymosis. Incisions throughout the abdomen which one to 2 cm without any dehiscence or surrounding pus or erythema. Left lower quadrant former drain site with gauze protruding there is no active drainage at this time. No pus. Mild distention.  No CVA tenderness. Genitourinary: Scrotum is swollen and edematous as well as the penis. The patient has a Foley catheter which has clear yellow urine in the bag. There is no tenderness, crepitus or erythema to the perineum.  Scrotum is nontender as well. No induration. Musculoskeletal:  Mild bilateral ankle edema. No joint effusions. Neurologic:  Normal speech and language. No gross focal neurologic deficits are appreciated. No gait instability. Skin:  Skin is warm, dry and intact. No rash noted. Psychiatric: Mood and affect are normal. Speech and behavior are normal.  ____________________________________________   LABS (all labs ordered are listed, but only abnormal results are displayed)  Labs Reviewed  CBC WITH DIFFERENTIAL/PLATELET - Abnormal; Notable for the following:    HCT 36.8 (*)    Lymphs Abs 0.6 (*)    All other components within normal limits  COMPREHENSIVE METABOLIC PANEL - Abnormal; Notable for the following:    Sodium 134 (*)    Potassium 2.7 (*)    Glucose, Bld 120 (*)    Calcium 8.2 (*)    Alkaline Phosphatase 37 (*)    All other components within normal limits  LIPASE, BLOOD  TYPE AND SCREEN    ____________________________________________  EKG  ____________________________________________  RADIOLOGY   IMPRESSION: 1. Moderate adynamic ileus of the colon. No evidence of bowel obstruction or acute bowel inflammation. 2. Expected postsurgical changes from recent prostatectomy. No fluid collections in the prostatectomy bed. 3. No hydronephrosis.   Electronically Signed By: Ilona Sorrel M.D. On: 05/21/2015 17:52 ____________________________________________   PROCEDURES    ____________________________________________   INITIAL IMPRESSION / ASSESSMENT AND PLAN / ED COURSE  Pertinent labs & imaging results that were available during my care of the patient were reviewed by me and considered in my medical decision making (see chart for details).  ----------------------------------------- 2:05 PM on 05/21/2015 ----------------------------------------- Discussed case with Dr. Alyson Ingles of the urology service who says that the patient may either be third spacing or could have bled into his scrotum. Recommends checking a CBC.  ----------------------------------------- 6:53 PM on 05/21/2015 -----------------------------------------  Discussed the case with Dr.  Loflin of Gen. surgery who recommends possibly using a stronger laxative. Also recommends following up with urology. I did discuss again with Dr. Noah Delaine of urology who recommends daily MiraLAX as well as trying mag citrate tonight or tomorrow.  ----------------------------------------- 7:22 PM on 05/21/2015 -----------------------------------------  Patient resting without any distress at this time. Discussed the CAT scan findings as well as suggestions from the consultants with the patient as well as his wife. They will try mag citrate tonight and then MiraLAX in the morning. They know to follow-up by calling Dr. Erlene Quan this coming Monday. They know to return for any worsening or concerning symptoms. We  also discussed reducing the amount of Percocet the patient is eating as this may be contributing to his ileus. He has not had any nausea or vomiting in the emergency department. Will be discharged home. He does say that he has had some flatus, although minimal, today. ____________________________________________   FINAL CLINICAL IMPRESSION(S) / ED DIAGNOSES  Peripheral edema including the scrotum. Ileus.    Orbie Pyo, MD 05/21/15 1924  Patient knows to keep his feet elevated in the evenings. Will be discharged with mesh jock strap to keep his scrotum elevated as well to help with his edema.  Orbie Pyo, MD 05/21/15 [id number]

## 2015-05-21 NOTE — ED Notes (Signed)
Pt catheter bag emptied, pt expressing concerns regarding dressing on abd wound, Rn (Gracie notified)

## 2015-05-21 NOTE — ED Notes (Signed)
Pt monitor unhooked for him to use restroom

## 2015-05-21 NOTE — ED Notes (Addendum)
Pt from home via EMS, had prostate surgery Tuesday and noticed swelling in his testicles last night . Foley cath draining. Pt also reports tenderness and swelling in abd. Report he has not had a bowel movement since Monday

## 2015-05-21 NOTE — Discharge Instructions (Signed)
Use the magnesium citrate tonight.  You may begin Miralax, daily, starting tomorrow.   Edema Edema is an abnormal buildup of fluids. It is more common in your legs and thighs. Painless swelling of the feet and ankles is more likely as a person ages. It also is common in looser skin, like around your eyes. HOME CARE   Keep the affected body part above the level of the heart while lying down.  Do not sit still or stand for a long time.  Do not put anything right under your knees when you lie down.  Do not wear tight clothes on your upper legs.  Exercise your legs to help the puffiness (swelling) go down.  Wear elastic bandages or support stockings as told by your doctor.  A low-salt diet may help lessen the puffiness.  Only take medicine as told by your doctor. GET HELP IF:  Treatment is not working.  You have heart, liver, or kidney disease and notice that your skin looks puffy or shiny.  You have puffiness in your legs that does not get better when you raise your legs.  You have sudden weight gain for no reason. GET HELP RIGHT AWAY IF:   You have shortness of breath or chest pain.  You cannot breathe when you lie down.  You have pain, redness, or warmth in the areas that are puffy.  You have heart, liver, or kidney disease and get edema all of a sudden.  You have a fever and your symptoms get worse all of a sudden. MAKE SURE YOU:   Understand these instructions.  Will watch your condition.  Will get help right away if you are not doing well or get worse.   This information is not intended to replace advice given to you by your health care provider. Make sure you discuss any questions you have with your health care provider.   Document Released: 08/15/2007 Document Revised: 03/03/2013 Document Reviewed: 12/19/2012 Elsevier Interactive Patient Education 2016 [employer].  Ileus  Ileus is a condition in which the intestines, also called the bowels, stop working  and moving correctly. If the intestines stop working, food cannot pass through to get digested. The intestines are hollow organs that digest food after the food leaves the stomach. These organs are long, muscular tubes that connect the stomach to the rectum. When ileus occurs, the muscular contractions that cause food to move through the intestines stop happening as they normally would. Ileus can occur for various reasons. This condition is a serious problem that usually requires hospitalization. It can cause symptoms such as nausea, abdominal pain, and bloating. Ileus can last from a few hours to a few days. If the intestines stop working because of a blockage, that is a different condition that is called a bowel obstruction. CAUSES This condition may be caused by:  Surgery on the abdomen.  An infection or inflammation in the abdomen. This includes inflammation of the lining of the abdomen (peritonitis).  Infection or inflammation in other parts of the body, such as pneumonia or pancreatitis.  Passage of gallstones or kidney stones.  Damage to the nerves or blood vessels that go to the intestines.  A collection of blood within the abdominal cavity.  Imbalance in the salts in the blood (electrolytes).  Injury to the brain or spinal cord.  Medicines. Many medicines, including strong pain medicines, can cause ileus or make it worse. SYMPTOMS Symptoms of this condition include:  Bloating of the abdomen.  Pain  or discomfort in the abdomen.  Poor appetite.  Nausea and vomiting.  Lack of normal bowel sounds, such as "growling" in the stomach. DIAGNOSIS This condition may be diagnosed with:  A physical exam and medical history.  X-rays or a CT scan of the abdomen. You may also have other tests to help find the cause of the condition. TREATMENT Treatment for this condition may include:  Resting the intestines until they start to work again. This is often done by:  Stopping oral  intake of food and drink. You will be given fluid through an IV tube to prevent dehydration.  Placing a small tube (nasogastric tube or NG tube) that is passed through your nose and into your stomach. The tube is attached to a suction device and keeps the stomach emptied out. This allows the bowels to rest and also helps to reduce nausea and vomiting.  Correcting any electrolyte imbalance by giving supplements in the IV fluid.  Stopping any medicines that might make ileus worse.  Treating any condition that may have caused ileus. HOME CARE INSTRUCTIONS  Follow instructions from your health care provider about diet and fluid intake. Usually, you will be told to:  Drink plenty of clear fluids.  Avoid alcohol.  Avoid caffeine.  Eat a bland diet.  Get plenty of rest. Return to your normal activities as told by your health care provider.  Take over-the-counter and prescription medicines only as told by your health care provider.  Keep all follow-up visits as told by your health care provider. This is important. SEEK MEDICAL CARE IF:  You have nausea, vomiting, or abdominal discomfort.  You have a fever. SEEK IMMEDIATE MEDICAL CARE IF:  You have severe abdominal pain or bloating.  You cannot eat or drink without vomiting.   This information is not intended to replace advice given to you by your health care provider. Make sure you discuss any questions you have with your health care provider.   Document Released: 03/01/2003 Document Revised: 11/17/2014 Document Reviewed: 04/22/2014 Elsevier Interactive Patient Education [employer].

## 2015-05-21 NOTE — ED Notes (Signed)
Notified of critical potassium, EDP made aware

## 2015-05-23 ENCOUNTER — Encounter: Payer: Self-pay | Admitting: Urology

## 2015-05-23 ENCOUNTER — Ambulatory Visit (INDEPENDENT_AMBULATORY_CARE_PROVIDER_SITE_OTHER): Payer: BLUE CROSS/BLUE SHIELD | Admitting: Urology

## 2015-05-23 VITALS — BP 147/89 | HR 81 | Ht 71.0 in | Wt 214.0 lb

## 2015-05-23 DIAGNOSIS — E876 Hypokalemia: Secondary | ICD-10-CM

## 2015-05-23 MED ORDER — POTASSIUM CHLORIDE ER 10 MEQ PO TBCR: 20.0000 meq | EXTENDED_RELEASE_TABLET | Freq: Two times a day (BID) | ORAL | Status: DC

## 2015-05-23 MED ORDER — TRAMADOL HCL 50 MG PO TABS: 50.0000 mg | ORAL_TABLET | Freq: Four times a day (QID) | ORAL | Status: DC | PRN

## 2015-05-23 NOTE — Progress Notes (Signed)
60 year old male who is status post robotic-assisted laparoscopic prostatectomy on 05/17/15. The patient's postoperative course has been complicated by an ileus. He has been to the emergency department 2 days ago which point he underwent a CT scan demonstrating the expected postoperative changes as well as in distended colon consistent with a ileus. There was no evidence of bowel obstruction. The patient has struggled with distention and discomfort. He did have one episode of emesis yesterday. He has been able to keep food down this morning. He has been drinking fine. He was instructed to take mag citrate and also Mira lax with Colace. The patient has been doing both. He has had several bowel movements. After the patient was discharged from the [facility] emergency department on Saturday he had some serosanguineous drainage from one of his incisions and this prompted a second trip to the emergency department, this time to [facility]. The patient has been reassured at both hospitals. He presents today with concern of the dripping from his incisions and his persistent distention.  The patient has not had any fevers or chills. He has not had any hematuria.  PE Filed Vitals:   05/23/15 1359  BP: 147/89  Pulse: 81   The patient is mildly jaundiced appearing. He is in no acute distress.  He is softly distended and tympanitic, no significant tenderness to palpation. His incisions have no erythema surrounding them. No purulent discharge. There is an ostomy appliance over the drain site, which is collecting mostly serous. Fluid. The Foley catheter is draining clear yellow urine.  He has symmetric extremities with no calf tenderness.   I reviewed both the patient's CT scan from the emergency department as well as his labs.His labs were significant for a potassium of 2.7. His hemoglobin and white blood cell count were normal.  Impression: The patient has postoperative ileus and some  peritoneal fluid draining from 2 of the 5 incisions. The incisions do not appear to be infected. There is no blood in his Foley catheter. He was hypokalemic on Saturday, his electrolytes have not been repleted nor repeated.  Plan: I reassured the patient and his wife. I changed the dressings on the incisions and recommended that when they stopped draining the take the dressings off. There is no indications for antibiotics. As no indication for any additional treatment except for repeating the patient's potassium. I've given him 20 mEq twice daily times 3 days. We've checked his basic metabolic panel today and we will check it again on Wednesday. He is scheduled to return on Wednesday for Foley catheter removal. I encouraged the patient to call with any worsening symptoms are concerned.

## 2015-05-24 LAB — BASIC METABOLIC PANEL
BUN / CREAT RATIO: 10 (ref 9–20)
BUN: 8 mg/dL (ref 6–24)
CO2: 26 mmol/L (ref 18–29)
CREATININE: 0.78 mg/dL (ref 0.76–1.27)
Calcium: 8.7 mg/dL (ref 8.7–10.2)
Chloride: 92 mmol/L — ABNORMAL LOW (ref 96–106)
GFR, EST AFRICAN AMERICAN: 114 mL/min/{1.73_m2} (ref >59)
GFR, EST NON AFRICAN AMERICAN: 99 mL/min/{1.73_m2} (ref >59)
GLUCOSE: 104 mg/dL — AB (ref 65–99)
Potassium: 3.2 mmol/L — ABNORMAL LOW (ref 3.5–5.2)
SODIUM: 136 mmol/L (ref 134–144)

## 2015-05-25 ENCOUNTER — Ambulatory Visit (INDEPENDENT_AMBULATORY_CARE_PROVIDER_SITE_OTHER): Payer: BLUE CROSS/BLUE SHIELD | Admitting: Urology

## 2015-05-25 ENCOUNTER — Telehealth: Payer: Self-pay

## 2015-05-25 ENCOUNTER — Encounter: Payer: Self-pay | Admitting: Urology

## 2015-05-25 VITALS — BP 118/80 | HR 77 | Ht 71.0 in | Wt 206.8 lb

## 2015-05-25 DIAGNOSIS — L24A9 Irritant contact dermatitis due friction or contact with other specified body fluids: Secondary | ICD-10-CM

## 2015-05-25 DIAGNOSIS — T148 Other injury of unspecified body region: Secondary | ICD-10-CM

## 2015-05-25 DIAGNOSIS — T148XXA Other injury of unspecified body region, initial encounter: Secondary | ICD-10-CM

## 2015-05-25 DIAGNOSIS — K567 Ileus, unspecified: Secondary | ICD-10-CM

## 2015-05-25 DIAGNOSIS — K6389 Other specified diseases of intestine: Secondary | ICD-10-CM

## 2015-05-25 DIAGNOSIS — C61 Malignant neoplasm of prostate: Secondary | ICD-10-CM

## 2015-05-25 DIAGNOSIS — E875 Hyperkalemia: Secondary | ICD-10-CM

## 2015-05-25 MED ORDER — POTASSIUM CHLORIDE ER 10 MEQ PO TBCR: 20.0000 meq | EXTENDED_RELEASE_TABLET | Freq: Two times a day (BID) | ORAL | Status: DC

## 2015-05-25 NOTE — Telephone Encounter (Signed)
LMOM- potassium levels have improved but continue taking supplements.

## 2015-05-25 NOTE — Progress Notes (Signed)
05/25/2015 4:04 PM   Charna Busman Aug 04, 1955 [id number]  Referring provider: [name], MD [place] [facility] [place], [place] [address]  Chief Complaint  Patient presents with  . Follow-up    prostatectomy, pt fell down x 2days ago. He contributes it to being weak from not eating.    HPI:  60 year old male status post robot assisted thorascopic prostatectomy on 05/17/2015. He returns to clinic today for possible Foley catheter removal and to review pathology.   His postoperative course has been complicated by an ileus which seems to be resolving. He is now having frequent loose bowel movements without any nausea or vomiting. His appetite is also started to increase. He is overall feeling a little bit less distended. He's had some leakage of peritoneal fluid through these incisions which have also started to slow. He is wearing a ostomy bag over the left lateral most incision at the drain site.    He did present over the weekend to the emergency room. CT scan showed no evidence of bowel obstruction and diffusely distended bowels but no intra-abdominal collections or other worrisome findings.  Labs were within normal limits other than potassium of 2.7, repeat 3.2.  He was prescribed potassium supplementation by earlier this week by Dr. Louis Meckel.  Pathology reviewed with the patient today.    pT2a Gleason 4+3 prostate cancer + ECE + margin at bladder neck, apex, posterio- lateral (NV bundle), anterior No SV involvement  Negative LN  (0/3) bilaterally   PMH: Past Medical History  Diagnosis Date  . Elevated PSA   . HLD (hyperlipidemia)   . HTN (hypertension)   . Prostate cancer [facility])     Surgical History: Past Surgical History  Procedure Laterality Date  . Hernia repair    . Robot assisted laparoscopic radical prostatectomy N/A 05/17/2015    Procedure: ROBOTIC ASSISTED LAPAROSCOPIC RADICAL PROSTATECTOMY;  Surgeon: Hollice Espy, MD;  Location:  [facility] ORS;  Service: Urology;  Laterality: N/A;  . Pelvic lymph node dissection N/A 05/17/2015    Procedure: PELVIC LYMPH NODE DISSECTION;  Surgeon: Hollice Espy, MD;  Location: [facility] ORS;  Service: Urology;  Laterality: N/A;    Home Medications:    Medication List       This list is accurate as of: 05/25/15 11:59 PM.  Always use your most recent med list.               bisoprolol-hydrochlorothiazide 5-6.25 MG tablet  Commonly known as:  ZIAC  Take 1 tablet by mouth daily.     docusate sodium 100 MG capsule  Commonly known as:  COLACE  Take 1 capsule (100 mg total) by mouth 2 (two) times daily.     oxyCODONE-acetaminophen 5-325 MG tablet  Commonly known as:  PERCOCET  Take 1-2 tablets by mouth every 4 (four) hours as needed for moderate pain or severe pain.     polyethylene glycol packet  Commonly known as:  MIRALAX / GLYCOLAX  Take 17 g by mouth daily.     potassium chloride 10 MEQ tablet  Commonly known as:  K-DUR  Take 2 tablets (20 mEq total) by mouth 2 (two) times daily.     traMADol 50 MG tablet  Commonly known as:  ULTRAM  Take 1-2 tablets (50-100 mg total) by mouth every 6 (six) hours as needed for moderate pain.        Allergies: No Known Allergies  Family History: Family History  Problem Relation Age of Onset  .  Kidney disease Neg Hx   . Prostate cancer Neg Hx     Social History:  reports that he has never smoked. His smokeless tobacco use includes Snuff. He reports that he drinks about 3.6 oz of alcohol per week. He reports that he does not use illicit drugs.  ROS: UROLOGY Frequent Urination?: No Hard to postpone urination?: No Burning/pain with urination?: No Get up at night to urinate?: No Leakage of urine?: Yes Urine stream starts and stops?: No Trouble starting stream?: No Do you have to strain to urinate?: Yes Blood in urine?: No Urinary tract infection?: No Sexually transmitted disease?: No Injury to kidneys or bladder?: No Painful  intercourse?: No Weak stream?: No Erection problems?: No Penile pain?: No  Gastrointestinal Nausea?: No Vomiting?: No Indigestion/heartburn?: No Diarrhea?: Yes Constipation?: No  Constitutional Fever: No Night sweats?: No Weight loss?: No Fatigue?: Yes  Skin Skin rash/lesions?: No Itching?: No  Eyes Blurred vision?: No Double vision?: No  Ears/Nose/Throat Sore throat?: No Sinus problems?: No  Hematologic/Lymphatic Swollen glands?: No Easy bruising?: No  Cardiovascular Leg swelling?: Yes Chest pain?: No  Respiratory Cough?: No Shortness of breath?: No  Endocrine Excessive thirst?: No  Musculoskeletal Back pain?: Yes Joint pain?: No  Neurological Headaches?: No Dizziness?: Yes  Psychologic Depression?: No Anxiety?: No  Physical Exam: BP 118/80 mmHg  Pulse 77  Ht 5\' 11"  (1.803 m)  Wt 206 lb 12.8 oz (93.804 kg)  BMI 28.86 kg/m2  Constitutional:  Alert and oriented, No acute distress. Wife present today HEENT: [place] AT, moist mucus membranes.  Trachea midline, no masses. Cardiovascular: No clubbing, cyanosis, or edema. Respiratory: Normal respiratory effort, no increased work of breathing. GI: Abdomen is soft, nontender, mildly distended, no abdominal masses.  Wounds c/d/i without surrounding erythema.  Scant straw-colored drainage from right and lateral most abdominal incisions. No evidence of herniation, seroma, or infection. GU: No CVA tenderness.  Foley catheter in place draining clear yellow urine. Mild diffuse scrotal edema, nontender.  Skin: No rashes, bruises or suspicious lesions. Neurologic: Grossly intact, no focal deficits, moving all 4 extremities. Psychiatric: Normal mood and affect.  Laboratory Data: Lab Results  Component Value Date   WBC 6.2 05/21/2015   HGB 13.0 05/21/2015   HCT 36.8* 05/21/2015   MCV 83.1 05/21/2015   PLT 227 05/21/2015    Lab Results  Component Value Date   CREATININE 0.78 05/23/2015    Urinalysis      Component Value Date/Time   COLORURINE YELLOW* 05/05/2015 0901   APPEARANCEUR CLEAR* 05/05/2015 0901   LABSPEC 1.010 05/05/2015 0901   PHURINE 5.0 05/05/2015 0901   GLUCOSEU NEGATIVE 05/05/2015 0901   HGBUR NEGATIVE 05/05/2015 0901   BILIRUBINUR NEGATIVE 05/05/2015 0901   KETONESUR NEGATIVE 05/05/2015 0901   PROTEINUR NEGATIVE 05/05/2015 0901   NITRITE NEGATIVE 05/05/2015 0901   LEUKOCYTESUR NEGATIVE 05/05/2015 0901    Pertinent Imaging: Study Result     CLINICAL DATA: Prostate cancer. Diffuse abdominal pain status post prostatectomy 5 days prior.  EXAM: CT ABDOMEN AND PELVIS WITH CONTRAST  TECHNIQUE: Multidetector CT imaging of the abdomen and pelvis was performed using the standard protocol following bolus administration of intravenous contrast.  CONTRAST: 127mL OMNIPAQUE IOHEXOL 300 MG/ML SOLN  COMPARISON: 04/11/2015 CT abdomen/pelvis.  FINDINGS: Lower chest: Mild-to-moderate bibasilar lower lobe atelectasis.  Hepatobiliary: There are 3 tiny hypodense lesions scattered throughout the liver, largest 0.4 cm in the lateral segment left liver lobe, too small to characterize, unchanged since 04/11/2015. Otherwise normal liver. Normal gallbladder with no  radiopaque cholelithiasis. No biliary ductal dilatation.  Pancreas: Normal, with no mass or duct dilation.  Spleen: Normal size. No mass.  Adrenals/Urinary Tract: Normal adrenals. Normal kidneys with no hydronephrosis and no renal mass. No urothelial wall thickening or gross filling defects in the opacified portions of the renal collecting systems or ureters, noting non opacification of portions of both ureters, limiting evaluation in these locations. Bladder is collapsed by indwelling Foley catheter. Gas in the nondependent bladder lumen is expected from the instrumentation. No definite bladder wall thickening. No gross focal bladder abnormality.  Stomach/Bowel: Grossly normal stomach. Normal  caliber small bowel with no small bowel wall thickening. Normal appendix. Oral contrast progresses to the hepatic flexure of the colon. There is moderate diffuse dilatation of the entire colon, which contains fluid levels and prominent gas, with no definite large bowel wall thickening or significant pericolonic fat stranding.  Vascular/Lymphatic: Atherosclerotic nonaneurysmal abdominal aorta. Patent portal, splenic, hepatic and renal veins. No pathologically enlarged lymph nodes in the abdomen or pelvis.  Reproductive: Status post prostatectomy. Scattered fat stranding, minimal gas and minimal ill-defined fluid in the prostatectomy bed and perivesical fat, within expected recent postoperative limits. No mass or fluid collection in the prostatectomy bed.  Other: No pneumoperitoneum, ascites or focal fluid collection. There is expected subcutaneous emphysema in the bilateral inguinal fat and right flank/ventral right abdominal wall subcutaneous fat.  Musculoskeletal: No aggressive appearing focal osseous lesions. Mild-to-moderate degenerative changes in the visualized thoracolumbar spine.  IMPRESSION: 1. Moderate adynamic ileus of the colon. No evidence of bowel obstruction or acute bowel inflammation. 2. Expected postsurgical changes from recent prostatectomy. No fluid collections in the prostatectomy bed. 3. No hydronephrosis.   Electronically Signed  By: Ilona Sorrel M.D.  On: 05/21/2015 17:52   Reviewed again today.  Assessment & Plan:    1. Prostate cancer ([place]) OK to remove Foley today, however, given the patient's ileus and other concerns, recommend keeping the Foley until Monday to allow his ileus to completely resolve.  T3a diease with +ECE, + margins.  Reviewed past. Again reiterated today that the patient will likely need adjuvant radiation, ideally to the anastomosis was allowed to heal. We will check PSA at 4 weeks postop and decide on timing of adjuvant  patient at that time. They also consider adjuvant hormonal therapy but not until the time of radiation for the purpose of PSA monitoring.   - PSA; Standing  2. Ileus ([place]) Resolving.  Warning symptoms reviewed today.  Recheck K Monday   -BMP future  3. Wound drainage Serous peritoneal fluid drainage related to ileus. Improving. Will likely resolve spontaneously in the next few days. If not, consider placing a stitch at wound sites next week.  4. Small bowel mass Persistent thickening of the small bowel seen on previous CT scan remains present on repeat CT. This was discussed with the patient today. We'll refer to gastroenterology for further workup.  referral to GI  Return for Foley removal monday (MD), f/u 4 weeks for PSA results.  Hollice Espy, MD  [facility] [address] [place], [place] [address] [phone]

## 2015-05-25 NOTE — Telephone Encounter (Signed)
Patient called back wanting results, per the note results were given, patient then asked for a refill of medication since he completed the 6 tablets given today. Per Dr. Erlene Quan ok to refill enough until Monday when patient will come in and have cath removal and we will draw a BMP at that time to recheck levels. Patient was notified of this, medication was sent to pharm and order for lab was placed/SW

## 2015-05-25 NOTE — Telephone Encounter (Signed)
-----   Message from Ardis Hughs, MD sent at 05/24/2015  4:36 PM EDT ----- Regarding: lab results Please contact patient and let them know that his potassium level is slightly improved but he should continue to take the potassium supplements as prescribed. Thank you, bh

## 2015-05-25 NOTE — Telephone Encounter (Signed)
Lestine Box, LPN at [id number] [phone] PM     Status: Signed       Expand All Collapse All   LMOM- potassium levels have improved but continue taking supplements.             Lestine Box, LPN at [facility] [phone] PM     Status: Signed       Expand All Collapse All   ----- Message from Ardis Hughs, MD sent at 05/24/2015 4:36 PM EDT ----- Regarding: lab results Please contact patient and let them know that his potassium level is slightly improved but he should continue to take the potassium supplements as prescribed. Thank you, bh

## 2015-05-26 ENCOUNTER — Encounter: Payer: Self-pay | Admitting: Urology

## 2015-05-27 ENCOUNTER — Telehealth: Payer: Self-pay | Admitting: Gastroenterology

## 2015-05-27 NOTE — Telephone Encounter (Signed)
I have called patient to make an appointment per Referral to see GI for small bowel mass. No answer. I have left a message on voicemail for patient to call office to make appointment.

## 2015-05-30 ENCOUNTER — Ambulatory Visit (INDEPENDENT_AMBULATORY_CARE_PROVIDER_SITE_OTHER): Payer: BLUE CROSS/BLUE SHIELD | Admitting: Urology

## 2015-05-30 ENCOUNTER — Encounter: Payer: Self-pay | Admitting: Urology

## 2015-05-30 VITALS — BP 135/66 | HR 71 | Ht 71.0 in | Wt 202.4 lb

## 2015-05-30 DIAGNOSIS — K567 Ileus, unspecified: Secondary | ICD-10-CM

## 2015-05-30 DIAGNOSIS — C61 Malignant neoplasm of prostate: Secondary | ICD-10-CM

## 2015-05-30 DIAGNOSIS — E875 Hyperkalemia: Secondary | ICD-10-CM

## 2015-05-30 DIAGNOSIS — R339 Retention of urine, unspecified: Secondary | ICD-10-CM

## 2015-05-30 NOTE — Progress Notes (Signed)
Catheter Removal  Patient is present today for a catheter removal.  21ml of water was drained from the balloon. A 18FR foley cath was removed from the bladder no complications were noted . Patient tolerated well but he is in some pain from the procedure and Larene Beach talked to the patient about this.  Preformed by: Lyndee Hensen CMA

## 2015-05-30 NOTE — Progress Notes (Addendum)
11:10 AM   Brad Patrick 13-Jan-1956 [id number]  Referring provider: [name], MD [place] [facility] [place], [place] [address]  Chief Complaint  Patient presents with  . Urinary Retention    Foley removal  . hyperkalemia    HPI: Patient presents today for foley catheter removal after undergoing a robotic prostatectomy on 05/17/2015 with Dr. Erlene Quan.  He states he had a good weekend.  He has been having bouts of diarrhea.  The wounds are not draining at this time.  His GI referral is pending.    Patient and his wife expressed concerns and frustrations concerning their hospital stay with me.   His postoperative course has been complicated by an ileus which seems to be resolving. He is now having frequent loose bowel movements without any nausea or vomiting. His appetite is also started to increase. He is overall feeling a little bit less distended. He's had some leakage of peritoneal fluid through these incisions which have also started to slow. He is wearing a ostomy bag over the left lateral most incision at the drain site.    He did present over the weekend to the emergency room. CT scan showed no evidence of bowel obstruction and diffusely distended bowels but no intra-abdominal collections or other worrisome findings.  Labs were within normal limits other than potassium of 2.7, repeat 3.2.  He was prescribed potassium supplementation by earlier this week by Dr. Louis Meckel.  Pathology reviewed with the patient at previous visit.    pT2a Gleason 4+3 prostate cancer + ECE + margin at bladder neck, apex, posterio- lateral (NV bundle), anterior No SV involvement  Negative LN  (0/3) bilaterally   PMH: Past Medical History  Diagnosis Date  . Elevated PSA   . HLD (hyperlipidemia)   . HTN (hypertension)   . Prostate cancer [facility])     Surgical History: Past Surgical History  Procedure Laterality Date  . Hernia repair    . Robot assisted  laparoscopic radical prostatectomy N/A 05/17/2015    Procedure: ROBOTIC ASSISTED LAPAROSCOPIC RADICAL PROSTATECTOMY;  Surgeon: Hollice Espy, MD;  Location: [facility] ORS;  Service: Urology;  Laterality: N/A;  . Pelvic lymph node dissection N/A 05/17/2015    Procedure: PELVIC LYMPH NODE DISSECTION;  Surgeon: Hollice Espy, MD;  Location: [facility] ORS;  Service: Urology;  Laterality: N/A;    Home Medications:    Medication List       This list is accurate as of: 05/30/15 11:10 AM.  Always use your most recent med list.               bisoprolol-hydrochlorothiazide 5-6.25 MG tablet  Commonly known as:  ZIAC  Take 1 tablet by mouth daily.     docusate sodium 100 MG capsule  Commonly known as:  COLACE  Take 1 capsule (100 mg total) by mouth 2 (two) times daily.     oxyCODONE-acetaminophen 5-325 MG tablet  Commonly known as:  PERCOCET  Take 1-2 tablets by mouth every 4 (four) hours as needed for moderate pain or severe pain.     polyethylene glycol packet  Commonly known as:  MIRALAX / GLYCOLAX  Take 17 g by mouth daily.     potassium chloride 10 MEQ tablet  Commonly known as:  K-DUR  Take 2 tablets (20 mEq total) by mouth 2 (two) times daily.     traMADol 50 MG tablet  Commonly known as:  ULTRAM  Take 1-2 tablets (50-100 mg total) by mouth  every 6 (six) hours as needed for moderate pain.        Allergies: No Known Allergies  Family History: Family History  Problem Relation Age of Onset  . Kidney disease Neg Hx   . Prostate cancer Neg Hx     Social History:  reports that he has never smoked. His smokeless tobacco use includes Snuff. He reports that he drinks about 3.6 oz of alcohol per week. He reports that he does not use illicit drugs.  ROS: UROLOGY Frequent Urination?: No Hard to postpone urination?: No Burning/pain with urination?: No Get up at night to urinate?: No Leakage of urine?: No Urine stream starts and stops?: No Trouble starting stream?: No Do you have to  strain to urinate?: No Blood in urine?: No Urinary tract infection?: No Sexually transmitted disease?: No Injury to kidneys or bladder?: No Painful intercourse?: No Weak stream?: No Erection problems?: No Penile pain?: No  Gastrointestinal Nausea?: No Vomiting?: No Indigestion/heartburn?: No Diarrhea?: No Constipation?: No  Constitutional Fever: No Night sweats?: No Weight loss?: No Fatigue?: No  Skin Skin rash/lesions?: No Itching?: No  Eyes Blurred vision?: No Double vision?: No  Ears/Nose/Throat Sore throat?: No Sinus problems?: No  Hematologic/Lymphatic Swollen glands?: No Easy bruising?: No  Cardiovascular Leg swelling?: No Chest pain?: No  Respiratory Cough?: No Shortness of breath?: No  Endocrine Excessive thirst?: No  Musculoskeletal Back pain?: No Joint pain?: No  Neurological Headaches?: No Dizziness?: No  Psychologic Depression?: No Anxiety?: No  Physical Exam: BP 135/66 mmHg  Pulse 71  Ht 5\' 11"  (1.803 m)  Wt 202 lb 6.4 oz (91.808 kg)  BMI 28.24 kg/m2  Constitutional:  Alert and oriented, No acute distress. Wife present today HEENT: [place] AT, moist mucus membranes.  Trachea midline, no masses. Cardiovascular: No clubbing, cyanosis, or edema. Respiratory: Normal respiratory effort, no increased work of breathing. GI: Abdomen is soft, nontender, mildly distended, no abdominal masses.  Wounds c/d/i without surrounding erythema.  No drainage from abdominal incisions. No evidence of herniation, seroma, or infection. GU: No CVA tenderness.  Foley catheter in place draining clear yellow urine. Mild diffuse scrotal edema, nontender.  Skin: No rashes, bruises or suspicious lesions. Neurologic: Grossly intact, no focal deficits, moving all 4 extremities. Psychiatric: Normal mood and affect.  Laboratory Data: Lab Results  Component Value Date   WBC 6.2 05/21/2015   HGB 13.0 05/21/2015   HCT 36.8* 05/21/2015   MCV 83.1 05/21/2015   PLT  227 05/21/2015   Lab Results  Component Value Date   CREATININE 0.78 05/23/2015   Pertinent Imaging: Study Result     CLINICAL DATA: Prostate cancer. Diffuse abdominal pain status post prostatectomy 5 days prior.  EXAM: CT ABDOMEN AND PELVIS WITH CONTRAST  TECHNIQUE: Multidetector CT imaging of the abdomen and pelvis was performed using the standard protocol following bolus administration of intravenous contrast.  CONTRAST: 167mL OMNIPAQUE IOHEXOL 300 MG/ML SOLN  COMPARISON: 04/11/2015 CT abdomen/pelvis.  FINDINGS: Lower chest: Mild-to-moderate bibasilar lower lobe atelectasis.  Hepatobiliary: There are 3 tiny hypodense lesions scattered throughout the liver, largest 0.4 cm in the lateral segment left liver lobe, too small to characterize, unchanged since 04/11/2015. Otherwise normal liver. Normal gallbladder with no radiopaque cholelithiasis. No biliary ductal dilatation.  Pancreas: Normal, with no mass or duct dilation.  Spleen: Normal size. No mass.  Adrenals/Urinary Tract: Normal adrenals. Normal kidneys with no hydronephrosis and no renal mass. No urothelial wall thickening or gross filling defects in the opacified portions of the renal collecting  systems or ureters, noting non opacification of portions of both ureters, limiting evaluation in these locations. Bladder is collapsed by indwelling Foley catheter. Gas in the nondependent bladder lumen is expected from the instrumentation. No definite bladder wall thickening. No gross focal bladder abnormality.  Stomach/Bowel: Grossly normal stomach. Normal caliber small bowel with no small bowel wall thickening. Normal appendix. Oral contrast progresses to the hepatic flexure of the colon. There is moderate diffuse dilatation of the entire colon, which contains fluid levels and prominent gas, with no definite large bowel wall thickening or significant pericolonic fat stranding.  Vascular/Lymphatic:  Atherosclerotic nonaneurysmal abdominal aorta. Patent portal, splenic, hepatic and renal veins. No pathologically enlarged lymph nodes in the abdomen or pelvis.  Reproductive: Status post prostatectomy. Scattered fat stranding, minimal gas and minimal ill-defined fluid in the prostatectomy bed and perivesical fat, within expected recent postoperative limits. No mass or fluid collection in the prostatectomy bed.  Other: No pneumoperitoneum, ascites or focal fluid collection. There is expected subcutaneous emphysema in the bilateral inguinal fat and right flank/ventral right abdominal wall subcutaneous fat.  Musculoskeletal: No aggressive appearing focal osseous lesions. Mild-to-moderate degenerative changes in the visualized thoracolumbar spine.  IMPRESSION: 1. Moderate adynamic ileus of the colon. No evidence of bowel obstruction or acute bowel inflammation. 2. Expected postsurgical changes from recent prostatectomy. No fluid collections in the prostatectomy bed. 3. No hydronephrosis.   Electronically Signed  By: Ilona Sorrel M.D.  On: 05/21/2015 17:52   Reviewed again today.  Assessment & Plan:    1. Prostate cancer ([place]) Foley removed today.  Patient advised to return to the office if he should experience any difficulty with urination.    T3a disease with +ECE, + margins.  Reviewed past. Again reiterated today that the patient will likely need adjuvant radiation, ideally to the anastomosis was allowed to heal. We will check PSA at 4 weeks postop and decide on timing of adjuvant patient at that time. They also consider adjuvant hormonal therapy but not until the time of radiation for the purpose of PSA monitoring.   - PSA; Standing  2. Ileus ([place]) Resolving.  Warning symptoms reviewed today.  GI referral pending.  BMP drawn today.    3. Wound drainage Serous peritoneal fluid drainage related to ileus.  Resolved for now.    4. Small bowel mass Persistent  thickening of the small bowel seen on previous CT scan remains present on repeat CT. This was discussed with the patient today. We'll refer to gastroenterology for further workup.  Patient was given an appointment with [facility], but they would prefer to see Dr. Rayann Heman at [facility].  We will make the arrangements.   referral to GI  Return in about 1 month (around 06/30/2015) for PSA blood draw.  Zara Council, [facility] [address], [place], [place] [address] [phone]

## 2015-05-31 ENCOUNTER — Telehealth: Payer: Self-pay

## 2015-05-31 ENCOUNTER — Encounter: Payer: Self-pay | Admitting: Gastroenterology

## 2015-05-31 LAB — BASIC METABOLIC PANEL
BUN/Creatinine Ratio: 11 (ref 9–20)
BUN: 9 mg/dL (ref 6–24)
CALCIUM: 8.8 mg/dL (ref 8.7–10.2)
CHLORIDE: 100 mmol/L (ref 96–106)
CO2: 22 mmol/L (ref 18–29)
Creatinine, Ser: 0.83 mg/dL (ref 0.76–1.27)
GFR, EST AFRICAN AMERICAN: 111 mL/min/{1.73_m2} (ref >59)
GFR, EST NON AFRICAN AMERICAN: 96 mL/min/{1.73_m2} (ref >59)
Glucose: 107 mg/dL — ABNORMAL HIGH (ref 65–99)
Potassium: 3.6 mmol/L (ref 3.5–5.2)
Sodium: 139 mmol/L (ref 134–144)

## 2015-05-31 NOTE — Telephone Encounter (Signed)
-----   Message from Hollice Espy, MD sent at 05/31/2015  7:48 AM EDT ----- Please let the patient know his potassium has now normalized and does not need any more supplementation.  Hollice Espy, MD

## 2015-05-31 NOTE — Telephone Encounter (Signed)
We are unable to contact patient--i have mailed the patient a letter to contact our office to make appointment with GI.

## 2015-05-31 NOTE — Telephone Encounter (Signed)
LMOM-potassium has normalized. No need for more supplementation.

## 2015-06-03 ENCOUNTER — Telehealth: Payer: Self-pay | Admitting: Urology

## 2015-06-03 NOTE — Telephone Encounter (Signed)
Pt stated that he just has a couple of questions that he needs to ask. Please advise

## 2015-06-03 NOTE — Telephone Encounter (Signed)
Spoke with patient and he wanted to know when he could start back driving and activities and work. Per Dr. Erlene Quan patient was instructed that he could start driving when he is no longer taking narcotics, and as far as activity he would need to wait for his 4wk post follow up apt to be released for work , patient was told he could walk and do very light activity for now. Patient verbalized understanding

## 2015-06-11 DIAGNOSIS — Z510 Encounter for antineoplastic radiation therapy: Secondary | ICD-10-CM | POA: Insufficient documentation

## 2015-06-11 DIAGNOSIS — C61 Malignant neoplasm of prostate: Secondary | ICD-10-CM | POA: Insufficient documentation

## 2015-06-13 ENCOUNTER — Other Ambulatory Visit: Payer: BLUE CROSS/BLUE SHIELD

## 2015-06-13 DIAGNOSIS — C61 Malignant neoplasm of prostate: Secondary | ICD-10-CM

## 2015-06-14 LAB — PSA: Prostate Specific Ag, Serum: 0.4 ng/mL (ref 0.0–4.0)

## 2015-06-15 ENCOUNTER — Ambulatory Visit (INDEPENDENT_AMBULATORY_CARE_PROVIDER_SITE_OTHER): Payer: BLUE CROSS/BLUE SHIELD | Admitting: Urology

## 2015-06-15 ENCOUNTER — Encounter: Payer: Self-pay | Admitting: Urology

## 2015-06-15 ENCOUNTER — Other Ambulatory Visit: Payer: Self-pay | Admitting: Physician Assistant

## 2015-06-15 VITALS — BP 158/94 | HR 86 | Ht 71.0 in | Wt 209.0 lb

## 2015-06-15 DIAGNOSIS — M5412 Radiculopathy, cervical region: Secondary | ICD-10-CM

## 2015-06-15 DIAGNOSIS — K6389 Other specified diseases of intestine: Secondary | ICD-10-CM

## 2015-06-15 DIAGNOSIS — N393 Stress incontinence (female) (male): Secondary | ICD-10-CM

## 2015-06-15 DIAGNOSIS — C61 Malignant neoplasm of prostate: Secondary | ICD-10-CM

## 2015-06-15 NOTE — Progress Notes (Signed)
5:18 PM  06/16/2015  Brad Patrick 07-02-1955 [id number]  Referring provider: [name], MD [facility] [place], [place] [address]  Chief Complaint  Patient presents with  . Prostate Cancer    3wk post op    HPI:  60 year old male status post robot assisted thorascopic prostatectomy on 05/17/2015.  His postoperative course has been complicated by an ileus.   pT2a Gleason 4+3 prostate cancer + ECE + margin at bladder neck, apex, posterio- lateral (NV bundle), anterior No SV involvement  Negative LN  (0/3) bilaterally  He is leaking and saturating several pads per day but changes them every time he goes to the bathroom for sanitary reasons.    He has been struggling with a "pinched" nerve in his neck.  He has seen Dr.Tumey on a prednisone taper with tome improvement.  He is still having some pain radiating down his left arm.    He has also seen Dr. Rayann Heman for small bowel mass.  He is scheduled for endoscopy later this month.      4 week post prostatectomy PSA 0.4 down from 33 preop.    PMH: Past Medical History  Diagnosis Date  . Elevated PSA   . HLD (hyperlipidemia)   . HTN (hypertension)   . Prostate cancer [facility])     Surgical History: Past Surgical History  Procedure Laterality Date  . Hernia repair    . Robot assisted laparoscopic radical prostatectomy N/A 05/17/2015    Procedure: ROBOTIC ASSISTED LAPAROSCOPIC RADICAL PROSTATECTOMY;  Surgeon: Hollice Espy, MD;  Location: [facility] ORS;  Service: Urology;  Laterality: N/A;  . Pelvic lymph node dissection N/A 05/17/2015    Procedure: PELVIC LYMPH NODE DISSECTION;  Surgeon: Hollice Espy, MD;  Location: [facility] ORS;  Service: Urology;  Laterality: N/A;    Home Medications:    Medication List       This list is accurate as of: 06/15/15 11:59 PM.  Always use your most recent med list.               bisoprolol-hydrochlorothiazide 5-6.25 MG tablet  Commonly known as:  ZIAC  Take 1  tablet by mouth daily.     diazepam 2 MG tablet  Commonly known as:  VALIUM  Reported on 06/15/2015     docusate sodium 100 MG capsule  Commonly known as:  COLACE  Take 1 capsule (100 mg total) by mouth 2 (two) times daily.     oxyCODONE-acetaminophen 5-325 MG tablet  Commonly known as:  PERCOCET  Take 1-2 tablets by mouth every 4 (four) hours as needed for moderate pain or severe pain.        Allergies: No Known Allergies  Family History: Family History  Problem Relation Age of Onset  . Kidney disease Neg Hx   . Prostate cancer Neg Hx     Social History:  reports that he has never smoked. His smokeless tobacco use includes Snuff. He reports that he drinks about 3.6 oz of alcohol per week. He reports that he does not use illicit drugs.  ROS: UROLOGY Frequent Urination?: Yes Hard to postpone urination?: Yes Burning/pain with urination?: No Get up at night to urinate?: Yes Leakage of urine?: Yes Urine stream starts and stops?: No Trouble starting stream?: No Do you have to strain to urinate?: No Blood in urine?: No Urinary tract infection?: No Sexually transmitted disease?: No Injury to kidneys or bladder?: No Painful intercourse?: No Weak stream?: No Erection problems?: No Penile  pain?: No  Gastrointestinal Nausea?: No Vomiting?: No Indigestion/heartburn?: No Diarrhea?: No Constipation?: No  Constitutional Fever: No Night sweats?: No Weight loss?: No Fatigue?: No  Skin Skin rash/lesions?: No Itching?: No  Eyes Blurred vision?: No Double vision?: No  Ears/Nose/Throat Sore throat?: No Sinus problems?: No  Hematologic/Lymphatic Swollen glands?: No Easy bruising?: No  Cardiovascular Leg swelling?: No Chest pain?: No  Respiratory Cough?: No Shortness of breath?: No  Endocrine Excessive thirst?: No  Musculoskeletal Back pain?: Yes Joint pain?: No  Neurological Headaches?: No Dizziness?: No  Psychologic Depression?: No Anxiety?:  No  Physical Exam: BP 158/94 mmHg  Pulse 86  Ht 5\' 11"  (1.803 m)  Wt 209 lb (94.802 kg)  BMI 29.16 kg/m2  Constitutional:  Alert and oriented, No acute distress. Wife present today HEENT: [place] AT, moist mucus membranes.  Trachea midline, no masses. Cardiovascular: No clubbing, cyanosis, or edema. Respiratory: Normal respiratory effort, no increased work of breathing. GI: Abdomen is soft, nontender,nondistended, no abdominal masses.  Abdominal wounds healing well.   Skin: No rashes, bruises or suspicious lesions. Neurologic: Grossly intact, no focal deficits, moving all 4 extremities. Psychiatric: Normal mood and affect.  Laboratory Data: Lab Results  Component Value Date   WBC 6.2 05/21/2015   HGB 13.0 05/21/2015   HCT 36.8* 05/21/2015   MCV 83.1 05/21/2015   PLT 227 05/21/2015    Lab Results  Component Value Date   CREATININE 0.83 05/30/2015   Component     Latest Ref Rng 03/01/2015 06/13/2015  PSA     0.0 - 4.0 ng/mL 33.2 (H) 0.4   Assessment & Plan:    1. Prostate cancer ([place]) T3a diease with +ECE, + margins.   PSA at 4 weeks detectable at 0.4 down from 33.  Consistent with residual pelvic diease.  Recommend adjuvant radiation which was anticipated but prefer to give it at least one more month for urethral healing.   Will rerefer to radiation oncology for discussion of adjuvant XRT Repeat PSA in 4 weeks, if rising, proceed with radiation.  If stable, consider waiting a few more weeks/ months to allow for continued healing.   Cleared for full activity  2. SUI Moderate, changes pads frequency for sanitary reason Encouraged Kegel and return to PT  3 Small bowel mass Has upcoming endoscopy with GI for further workup.   Return in about 3 months (around 09/14/2015) for f/u prostate cancer (lab only visit in 4 weeks for PSA).  Hollice Espy, MD  [facility] [address] [place], [place] [address] [phone]

## 2015-06-16 ENCOUNTER — Encounter: Payer: Self-pay | Admitting: Urology

## 2015-06-16 ENCOUNTER — Ambulatory Visit
Admission: RE | Admit: 2015-06-16 | Discharge: 2015-06-16 | Disposition: A | Discharge status: Home / Self Care | Payer: BLUE CROSS/BLUE SHIELD | Source: Ambulatory Visit | Attending: Physician Assistant | Admitting: Physician Assistant

## 2015-06-16 DIAGNOSIS — M5412 Radiculopathy, cervical region: Secondary | ICD-10-CM

## 2015-06-27 ENCOUNTER — Ambulatory Visit
Admission: RE | Admit: 2015-06-27 | Discharge: 2015-06-27 | Disposition: A | Discharge status: Home / Self Care | Payer: BLUE CROSS/BLUE SHIELD | Source: Ambulatory Visit | Attending: Radiation Oncology | Admitting: Radiation Oncology

## 2015-06-27 ENCOUNTER — Encounter: Payer: Self-pay | Admitting: Radiation Oncology

## 2015-06-27 VITALS — BP 146/87 | HR 82 | Temp 98.1°F | Resp 20 | Wt 206.2 lb

## 2015-06-27 DIAGNOSIS — C61 Malignant neoplasm of prostate: Secondary | ICD-10-CM | POA: Diagnosis present

## 2015-06-27 DIAGNOSIS — Z510 Encounter for antineoplastic radiation therapy: Secondary | ICD-10-CM | POA: Diagnosis not present

## 2015-06-27 NOTE — Consult Note (Signed)
Except an outstanding is perfect of Radiation Oncology NEW PATIENT EVALUATION  Name: Brad Patrick  MRN: [id number]  Date:   06/27/2015     DOB: [birth year]   This 60 y.o. male patient presents to the clinic for initial evaluation of prostate cancer stage IIIa (T3a N0 M0) presenting the PSA of 33 status post robotic prostatectomy now with persistent elevation of PSA and positive surgical margins.  REFERRING PHYSICIAN: [name], MD  CHIEF COMPLAINT:  Chief Complaint  Patient presents with  . Prostate Cancer    Pt is here for follow up prostate cancer.      DIAGNOSIS: The encounter diagnosis was Malignant neoplasm of prostate ([place]).   PREVIOUS INVESTIGATIONS:  Scans reviewed Clinical notes reviewed Pathology report reviewed  HPI: Patient is a 60 year old male originally consult for elevated PSA at 33 and initial biopsy positive for Gleason 7 (3+4). Patient opted for robotic prostatectomy after CT scan and bone scan showed no evidence of advanced disease. At time of prostatectomy bladder neck was involved with 3 pelvic lymph nodes negative for metastatic disease. The was a focus of Gleason 7 (4+3) in the prostate base. Seminal vesicles were not involved. Bladder neck was involved as well as apical anterior and posterior lateral. Patient has had urinary incontinence since surgery. Also has erectile dysfunction. He's surgery was by ileus which has resolved. His most recent PSA was 0.4. I've asked to evaluate the patient for possible salvage radiation therapy.  PLANNED TREATMENT REGIMEN: Salvage radiation of both prostatic fossa as well as pelvic lymph nodes using I M RT treatment planning and delivery  PAST MEDICAL HISTORY:  has a past medical history of Elevated PSA; HLD (hyperlipidemia); HTN (hypertension); and Prostate cancer ([place]).    PAST SURGICAL HISTORY:  Past Surgical History  Procedure Laterality Date  . Hernia repair    . Robot assisted laparoscopic radical  prostatectomy N/A 05/17/2015    Procedure: ROBOTIC ASSISTED LAPAROSCOPIC RADICAL PROSTATECTOMY;  Surgeon: Hollice Espy, MD;  Location: [facility] ORS;  Service: Urology;  Laterality: N/A;  . Pelvic lymph node dissection N/A 05/17/2015    Procedure: PELVIC LYMPH NODE DISSECTION;  Surgeon: Hollice Espy, MD;  Location: [facility] ORS;  Service: Urology;  Laterality: N/A;    FAMILY HISTORY: family history is negative for Kidney disease and Prostate cancer.  SOCIAL HISTORY:  reports that he has never smoked. His smokeless tobacco use includes Snuff. He reports that he drinks about 3.6 oz of alcohol per week. He reports that he does not use illicit drugs.  ALLERGIES: Review of patient's allergies indicates no known allergies.  MEDICATIONS:  Current Outpatient Prescriptions  Medication Sig Dispense Refill  . bisoprolol-hydrochlorothiazide (ZIAC) 5-6.25 MG tablet Take 1 tablet by mouth daily.     . diazepam (VALIUM) 2 MG tablet Reported on 06/15/2015  0  . docusate sodium (COLACE) 100 MG capsule Take 1 capsule (100 mg total) by mouth 2 (two) times daily. 60 capsule 0  . oxyCODONE-acetaminophen (PERCOCET) 5-325 MG tablet Take 1-2 tablets by mouth every 4 (four) hours as needed for moderate pain or severe pain. 20 tablet 0  . traMADol (ULTRAM) 50 MG tablet Take by mouth. Reported on 06/27/2015     No current facility-administered medications for this encounter.    ECOG PERFORMANCE STATUS:  0 - Asymptomatic  REVIEW OF SYSTEMS:  Patient denies any weight loss, fatigue, weakness, fever, chills or night sweats. Patient denies any loss of vision, blurred vision. Patient denies any ringing  of the ears or  hearing loss. No irregular heartbeat. Patient denies heart murmur or history of fainting. Patient denies any chest pain or pain radiating to her upper extremities. Patient denies any shortness of breath, difficulty breathing at night, cough or hemoptysis. Patient denies any swelling in the lower legs. Patient denies  any nausea vomiting, vomiting of blood, or coffee ground material in the vomitus. Patient denies any stomach pain. Patient states has had normal bowel movements no significant constipation or diarrhea. Patient denies any dysuria, hematuria or significant nocturia. Patient denies any problems walking, swelling in the joints or loss of balance. Patient denies any skin changes, loss of hair or loss of weight. Patient denies any excessive worrying or anxiety or significant depression. Patient denies any problems with insomnia. Patient denies excessive thirst, polyuria, polydipsia. Patient denies any swollen glands, patient denies easy bruising or easy bleeding. Patient denies any recent infections, allergies or URI. Patient "s visual fields have not changed significantly in recent time.    PHYSICAL EXAM: BP 146/87 mmHg  Pulse 82  Temp(Src) 98.1 F (36.7 C)  Resp 20  Wt 206 lb 3.9 oz (93.55 kg) On rectal exam rectal sphincter tone is within normal limits. Prostatic fossa is clear without evidence of nodularity or mass. Well-developed well-nourished patient in NAD. HEENT reveals PERLA, EOMI, discs not visualized.  Oral cavity is clear. No oral mucosal lesions are identified. Neck is clear without evidence of cervical or supraclavicular adenopathy. Lungs are clear to A&P. Cardiac examination is essentially unremarkable with regular rate and rhythm without murmur rub or thrill. Abdomen is benign with no organomegaly or masses noted. Motor sensory and DTR levels are equal and symmetric in the upper and lower extremities. Cranial nerves II through XII are grossly intact. Proprioception is intact. No peripheral adenopathy or edema is identified. No motor or sensory levels are noted. Crude visual fields are within normal range.  LABORATORY DATA: Pathology reports reviewed    RADIOLOGY RESULTS: Initial CT scan and bone scan reviewed   IMPRESSION: Stage III by extra prostatic extension adenocarcinoma the  prostate Gleason score of 7 (4+3) with persistent PSA after robotic prostatectomy.  PLAN: At this time urology would like to wait another several weeks to re-check his PSA. Based on the positive margin persistent PSA would favor salvage radiation therapy to his prostatic fossa and pelvic lymph nodes. Would plan on delivering 7600 cGy which is a slightly higher than standard dose although abuse this dose escalation in the past consistently without significant side effects. Also treat pelvic knows he to 5400 cGy using IMRTdose painting technique. I would also favor androgen deprivation therapy for at least 16 months based on the recent RTOG study. We'll await another several weeks to have his PSA retested and then move forward with simulation. My review of data on further injury to urethra and sphincter caused by adjuvant radiation therapy does not support significant delay in salvage radiation. Case was discussed at our weekly tumor conference. Patient has been set up and I have personally ordered CT simulation in the next several weeks. Risks and benefits of treatment including increased lower urinary tract symptoms secondary to possible radiation cystitis, fatigue, alteration of blood counts, diarrhea skin reaction all were discussed with the patient.  I would like to take this opportunity for allowing me to participate in the care of your patient.Armstead Peaks., MD

## 2015-06-29 ENCOUNTER — Ambulatory Visit: Payer: BLUE CROSS/BLUE SHIELD

## 2015-06-29 ENCOUNTER — Ambulatory Visit
Admission: RE | Admit: 2015-06-29 | Discharge: 2015-06-29 | Disposition: A | Discharge status: Home / Self Care | Payer: BLUE CROSS/BLUE SHIELD | Source: Ambulatory Visit | Attending: Radiation Oncology | Admitting: Radiation Oncology

## 2015-07-12 ENCOUNTER — Ambulatory Visit: Payer: BLUE CROSS/BLUE SHIELD | Admitting: Physical Therapy

## 2015-07-13 ENCOUNTER — Other Ambulatory Visit: Payer: BLUE CROSS/BLUE SHIELD

## 2015-07-13 DIAGNOSIS — C61 Malignant neoplasm of prostate: Secondary | ICD-10-CM

## 2015-07-14 ENCOUNTER — Telehealth: Payer: Self-pay | Admitting: Urology

## 2015-07-14 ENCOUNTER — Encounter: Payer: BLUE CROSS/BLUE SHIELD | Admitting: Physical Therapy

## 2015-07-14 LAB — PSA: Prostate Specific Ag, Serum: 0.3 ng/mL (ref 0.0–4.0)

## 2015-07-14 NOTE — Telephone Encounter (Signed)
Spoke with patient and he has an appt with you in July and will call me once he has set the appt up at the cancer center and we will go from there and if I need to push the July appt back some I will do that.   Sharyn Lull

## 2015-07-15 ENCOUNTER — Ambulatory Visit
Admission: RE | Admit: 2015-07-15 | Discharge: 2015-07-15 | Disposition: A | Discharge status: Home / Self Care | Payer: BLUE CROSS/BLUE SHIELD | Source: Ambulatory Visit | Attending: Radiation Oncology | Admitting: Radiation Oncology

## 2015-07-15 ENCOUNTER — Ambulatory Visit: Payer: BLUE CROSS/BLUE SHIELD

## 2015-07-18 ENCOUNTER — Ambulatory Visit: Payer: BLUE CROSS/BLUE SHIELD

## 2015-07-19 ENCOUNTER — Encounter: Payer: BLUE CROSS/BLUE SHIELD | Admitting: Physical Therapy

## 2015-07-21 ENCOUNTER — Encounter: Payer: BLUE CROSS/BLUE SHIELD | Admitting: Physical Therapy

## 2015-07-21 ENCOUNTER — Ambulatory Visit
Admission: RE | Admit: 2015-07-21 | Discharge: 2015-07-21 | Disposition: A | Discharge status: Home / Self Care | Payer: BLUE CROSS/BLUE SHIELD | Source: Ambulatory Visit | Attending: Radiation Oncology | Admitting: Radiation Oncology

## 2015-07-26 DIAGNOSIS — C61 Malignant neoplasm of prostate: Secondary | ICD-10-CM | POA: Diagnosis not present

## 2015-07-27 DIAGNOSIS — C61 Malignant neoplasm of prostate: Secondary | ICD-10-CM | POA: Diagnosis not present

## 2015-07-29 ENCOUNTER — Other Ambulatory Visit: Payer: Self-pay | Admitting: *Deleted

## 2015-07-29 DIAGNOSIS — C61 Malignant neoplasm of prostate: Secondary | ICD-10-CM | POA: Diagnosis not present

## 2015-08-01 ENCOUNTER — Ambulatory Visit
Admission: RE | Admit: 2015-08-01 | Discharge: 2015-08-01 | Disposition: A | Discharge status: Home / Self Care | Payer: BLUE CROSS/BLUE SHIELD | Source: Ambulatory Visit | Attending: Radiation Oncology | Admitting: Radiation Oncology

## 2015-08-02 ENCOUNTER — Ambulatory Visit
Admission: RE | Admit: 2015-08-02 | Discharge: 2015-08-02 | Disposition: A | Discharge status: Home / Self Care | Payer: BLUE CROSS/BLUE SHIELD | Source: Ambulatory Visit | Attending: Radiation Oncology | Admitting: Radiation Oncology

## 2015-08-02 DIAGNOSIS — C61 Malignant neoplasm of prostate: Secondary | ICD-10-CM | POA: Diagnosis not present

## 2015-08-03 ENCOUNTER — Ambulatory Visit
Admission: RE | Admit: 2015-08-03 | Discharge: 2015-08-03 | Disposition: A | Discharge status: Home / Self Care | Payer: BLUE CROSS/BLUE SHIELD | Source: Ambulatory Visit | Attending: Radiation Oncology | Admitting: Radiation Oncology

## 2015-08-03 DIAGNOSIS — C61 Malignant neoplasm of prostate: Secondary | ICD-10-CM | POA: Diagnosis not present

## 2015-08-04 ENCOUNTER — Ambulatory Visit
Admission: RE | Admit: 2015-08-04 | Discharge: 2015-08-04 | Disposition: A | Discharge status: Home / Self Care | Payer: BLUE CROSS/BLUE SHIELD | Source: Ambulatory Visit | Attending: Radiation Oncology | Admitting: Radiation Oncology

## 2015-08-04 DIAGNOSIS — C61 Malignant neoplasm of prostate: Secondary | ICD-10-CM | POA: Diagnosis not present

## 2015-08-05 ENCOUNTER — Ambulatory Visit
Admission: RE | Admit: 2015-08-05 | Discharge: 2015-08-05 | Disposition: A | Discharge status: Home / Self Care | Payer: BLUE CROSS/BLUE SHIELD | Source: Ambulatory Visit | Attending: Radiation Oncology | Admitting: Radiation Oncology

## 2015-08-05 DIAGNOSIS — C61 Malignant neoplasm of prostate: Secondary | ICD-10-CM | POA: Diagnosis not present

## 2015-08-09 ENCOUNTER — Ambulatory Visit
Admission: RE | Admit: 2015-08-09 | Discharge: 2015-08-09 | Disposition: A | Discharge status: Home / Self Care | Payer: BLUE CROSS/BLUE SHIELD | Source: Ambulatory Visit | Attending: Radiation Oncology | Admitting: Radiation Oncology

## 2015-08-09 DIAGNOSIS — C61 Malignant neoplasm of prostate: Secondary | ICD-10-CM | POA: Diagnosis not present

## 2015-08-10 ENCOUNTER — Ambulatory Visit
Admission: RE | Admit: 2015-08-10 | Discharge: 2015-08-10 | Disposition: A | Discharge status: Home / Self Care | Payer: BLUE CROSS/BLUE SHIELD | Source: Ambulatory Visit | Attending: Radiation Oncology | Admitting: Radiation Oncology

## 2015-08-10 DIAGNOSIS — C61 Malignant neoplasm of prostate: Secondary | ICD-10-CM | POA: Diagnosis not present

## 2015-08-11 ENCOUNTER — Ambulatory Visit
Admission: RE | Admit: 2015-08-11 | Discharge: 2015-08-11 | Disposition: A | Discharge status: Home / Self Care | Payer: BLUE CROSS/BLUE SHIELD | Source: Ambulatory Visit | Attending: Radiation Oncology | Admitting: Radiation Oncology

## 2015-08-11 DIAGNOSIS — C61 Malignant neoplasm of prostate: Secondary | ICD-10-CM | POA: Diagnosis not present

## 2015-08-12 ENCOUNTER — Inpatient Hospital Stay: Payer: BLUE CROSS/BLUE SHIELD | Attending: Radiation Oncology

## 2015-08-12 ENCOUNTER — Ambulatory Visit
Admission: RE | Admit: 2015-08-12 | Discharge: 2015-08-12 | Disposition: A | Discharge status: Home / Self Care | Payer: BLUE CROSS/BLUE SHIELD | Source: Ambulatory Visit | Attending: Radiation Oncology | Admitting: Radiation Oncology

## 2015-08-12 DIAGNOSIS — C61 Malignant neoplasm of prostate: Secondary | ICD-10-CM | POA: Diagnosis present

## 2015-08-12 LAB — CBC
HCT: 39.7 % — ABNORMAL LOW (ref 40.0–52.0)
Hemoglobin: 13.8 g/dL (ref 13.0–18.0)
MCH: 28.9 pg (ref 26.0–34.0)
MCHC: 34.9 g/dL (ref 32.0–36.0)
MCV: 83 fL (ref 80.0–100.0)
PLATELETS: 155 10*3/uL (ref 150–440)
RBC: 4.78 MIL/uL (ref 4.40–5.90)
RDW: 15.6 % — AB (ref 11.5–14.5)
WBC: 4 10*3/uL (ref 3.8–10.6)

## 2015-08-15 ENCOUNTER — Ambulatory Visit
Admission: RE | Admit: 2015-08-15 | Discharge: 2015-08-15 | Disposition: A | Discharge status: Home / Self Care | Payer: BLUE CROSS/BLUE SHIELD | Source: Ambulatory Visit | Attending: Radiation Oncology | Admitting: Radiation Oncology

## 2015-08-15 DIAGNOSIS — C61 Malignant neoplasm of prostate: Secondary | ICD-10-CM | POA: Diagnosis not present

## 2015-08-16 ENCOUNTER — Ambulatory Visit
Admission: RE | Admit: 2015-08-16 | Discharge: 2015-08-16 | Disposition: A | Discharge status: Home / Self Care | Payer: BLUE CROSS/BLUE SHIELD | Source: Ambulatory Visit | Attending: Radiation Oncology | Admitting: Radiation Oncology

## 2015-08-16 DIAGNOSIS — C61 Malignant neoplasm of prostate: Secondary | ICD-10-CM | POA: Diagnosis not present

## 2015-08-17 ENCOUNTER — Ambulatory Visit
Admission: RE | Admit: 2015-08-17 | Discharge: 2015-08-17 | Disposition: A | Discharge status: Home / Self Care | Payer: BLUE CROSS/BLUE SHIELD | Source: Ambulatory Visit | Attending: Radiation Oncology | Admitting: Radiation Oncology

## 2015-08-17 DIAGNOSIS — C61 Malignant neoplasm of prostate: Secondary | ICD-10-CM | POA: Diagnosis not present

## 2015-08-18 ENCOUNTER — Ambulatory Visit
Admission: RE | Admit: 2015-08-18 | Discharge: 2015-08-18 | Disposition: A | Discharge status: Home / Self Care | Payer: BLUE CROSS/BLUE SHIELD | Source: Ambulatory Visit | Attending: Radiation Oncology | Admitting: Radiation Oncology

## 2015-08-18 DIAGNOSIS — C61 Malignant neoplasm of prostate: Secondary | ICD-10-CM | POA: Diagnosis not present

## 2015-08-19 ENCOUNTER — Ambulatory Visit
Admission: RE | Admit: 2015-08-19 | Discharge: 2015-08-19 | Disposition: A | Discharge status: Home / Self Care | Payer: BLUE CROSS/BLUE SHIELD | Source: Ambulatory Visit | Attending: Radiation Oncology | Admitting: Radiation Oncology

## 2015-08-19 DIAGNOSIS — C61 Malignant neoplasm of prostate: Secondary | ICD-10-CM | POA: Diagnosis not present

## 2015-08-22 ENCOUNTER — Ambulatory Visit
Admission: RE | Admit: 2015-08-22 | Discharge: 2015-08-22 | Disposition: A | Discharge status: Home / Self Care | Payer: BLUE CROSS/BLUE SHIELD | Source: Ambulatory Visit | Attending: Radiation Oncology | Admitting: Radiation Oncology

## 2015-08-22 DIAGNOSIS — C61 Malignant neoplasm of prostate: Secondary | ICD-10-CM | POA: Diagnosis not present

## 2015-08-23 ENCOUNTER — Ambulatory Visit
Admission: RE | Admit: 2015-08-23 | Discharge: 2015-08-23 | Disposition: A | Discharge status: Home / Self Care | Payer: BLUE CROSS/BLUE SHIELD | Source: Ambulatory Visit | Attending: Radiation Oncology | Admitting: Radiation Oncology

## 2015-08-23 DIAGNOSIS — C61 Malignant neoplasm of prostate: Secondary | ICD-10-CM | POA: Diagnosis not present

## 2015-08-24 ENCOUNTER — Ambulatory Visit
Admission: RE | Admit: 2015-08-24 | Discharge: 2015-08-24 | Disposition: A | Discharge status: Home / Self Care | Payer: BLUE CROSS/BLUE SHIELD | Source: Ambulatory Visit | Attending: Radiation Oncology | Admitting: Radiation Oncology

## 2015-08-24 DIAGNOSIS — C61 Malignant neoplasm of prostate: Secondary | ICD-10-CM | POA: Diagnosis not present

## 2015-08-25 ENCOUNTER — Ambulatory Visit
Admission: RE | Admit: 2015-08-25 | Discharge: 2015-08-25 | Disposition: A | Discharge status: Home / Self Care | Payer: BLUE CROSS/BLUE SHIELD | Source: Ambulatory Visit | Attending: Radiation Oncology | Admitting: Radiation Oncology

## 2015-08-25 DIAGNOSIS — C61 Malignant neoplasm of prostate: Secondary | ICD-10-CM | POA: Diagnosis not present

## 2015-08-26 ENCOUNTER — Ambulatory Visit
Admission: RE | Admit: 2015-08-26 | Discharge: 2015-08-26 | Disposition: A | Discharge status: Home / Self Care | Payer: BLUE CROSS/BLUE SHIELD | Source: Ambulatory Visit | Attending: Radiation Oncology | Admitting: Radiation Oncology

## 2015-08-26 ENCOUNTER — Inpatient Hospital Stay: Payer: BLUE CROSS/BLUE SHIELD

## 2015-08-26 DIAGNOSIS — C61 Malignant neoplasm of prostate: Secondary | ICD-10-CM

## 2015-08-26 LAB — CBC
HCT: 40.3 % (ref 40.0–52.0)
Hemoglobin: 14.3 g/dL (ref 13.0–18.0)
MCH: 29.2 pg (ref 26.0–34.0)
MCHC: 35.4 g/dL (ref 32.0–36.0)
MCV: 82.4 fL (ref 80.0–100.0)
PLATELETS: 153 10*3/uL (ref 150–440)
RBC: 4.89 MIL/uL (ref 4.40–5.90)
RDW: 16.5 % — AB (ref 11.5–14.5)
WBC: 4 10*3/uL (ref 3.8–10.6)

## 2015-08-29 ENCOUNTER — Ambulatory Visit
Admission: RE | Admit: 2015-08-29 | Discharge: 2015-08-29 | Disposition: A | Discharge status: Home / Self Care | Payer: BLUE CROSS/BLUE SHIELD | Source: Ambulatory Visit | Attending: Radiation Oncology | Admitting: Radiation Oncology

## 2015-08-29 DIAGNOSIS — C61 Malignant neoplasm of prostate: Secondary | ICD-10-CM | POA: Diagnosis not present

## 2015-08-30 ENCOUNTER — Ambulatory Visit
Admission: RE | Admit: 2015-08-30 | Discharge: 2015-08-30 | Disposition: A | Discharge status: Home / Self Care | Payer: BLUE CROSS/BLUE SHIELD | Source: Ambulatory Visit | Attending: Radiation Oncology | Admitting: Radiation Oncology

## 2015-08-30 DIAGNOSIS — C61 Malignant neoplasm of prostate: Secondary | ICD-10-CM | POA: Diagnosis not present

## 2015-08-31 ENCOUNTER — Ambulatory Visit: Payer: BLUE CROSS/BLUE SHIELD

## 2015-09-01 ENCOUNTER — Ambulatory Visit
Admission: RE | Admit: 2015-09-01 | Discharge: 2015-09-01 | Disposition: A | Discharge status: Home / Self Care | Payer: BLUE CROSS/BLUE SHIELD | Source: Ambulatory Visit | Attending: Radiation Oncology | Admitting: Radiation Oncology

## 2015-09-01 DIAGNOSIS — C61 Malignant neoplasm of prostate: Secondary | ICD-10-CM | POA: Diagnosis not present

## 2015-09-02 ENCOUNTER — Ambulatory Visit
Admission: RE | Admit: 2015-09-02 | Discharge: 2015-09-02 | Disposition: A | Discharge status: Home / Self Care | Payer: BLUE CROSS/BLUE SHIELD | Source: Ambulatory Visit | Attending: Radiation Oncology | Admitting: Radiation Oncology

## 2015-09-02 DIAGNOSIS — C61 Malignant neoplasm of prostate: Secondary | ICD-10-CM | POA: Diagnosis not present

## 2015-09-05 ENCOUNTER — Ambulatory Visit
Admission: RE | Admit: 2015-09-05 | Discharge: 2015-09-05 | Disposition: A | Discharge status: Home / Self Care | Payer: BLUE CROSS/BLUE SHIELD | Source: Ambulatory Visit | Attending: Radiation Oncology | Admitting: Radiation Oncology

## 2015-09-05 DIAGNOSIS — C61 Malignant neoplasm of prostate: Secondary | ICD-10-CM | POA: Diagnosis not present

## 2015-09-06 ENCOUNTER — Ambulatory Visit
Admission: RE | Admit: 2015-09-06 | Discharge: 2015-09-06 | Disposition: A | Discharge status: Home / Self Care | Payer: BLUE CROSS/BLUE SHIELD | Source: Ambulatory Visit | Attending: Radiation Oncology | Admitting: Radiation Oncology

## 2015-09-06 DIAGNOSIS — C61 Malignant neoplasm of prostate: Secondary | ICD-10-CM | POA: Diagnosis not present

## 2015-09-07 ENCOUNTER — Ambulatory Visit
Admission: RE | Admit: 2015-09-07 | Discharge: 2015-09-07 | Disposition: A | Discharge status: Home / Self Care | Payer: BLUE CROSS/BLUE SHIELD | Source: Ambulatory Visit | Attending: Radiation Oncology | Admitting: Radiation Oncology

## 2015-09-07 DIAGNOSIS — C61 Malignant neoplasm of prostate: Secondary | ICD-10-CM | POA: Diagnosis not present

## 2015-09-08 ENCOUNTER — Ambulatory Visit
Admission: RE | Admit: 2015-09-08 | Discharge: 2015-09-08 | Disposition: A | Discharge status: Home / Self Care | Payer: BLUE CROSS/BLUE SHIELD | Source: Ambulatory Visit | Attending: Radiation Oncology | Admitting: Radiation Oncology

## 2015-09-08 ENCOUNTER — Ambulatory Visit: Payer: BLUE CROSS/BLUE SHIELD

## 2015-09-09 ENCOUNTER — Inpatient Hospital Stay: Payer: BLUE CROSS/BLUE SHIELD

## 2015-09-09 ENCOUNTER — Ambulatory Visit
Admission: RE | Admit: 2015-09-09 | Discharge: 2015-09-09 | Disposition: A | Discharge status: Home / Self Care | Payer: BLUE CROSS/BLUE SHIELD | Source: Ambulatory Visit | Attending: Radiation Oncology | Admitting: Radiation Oncology

## 2015-09-09 DIAGNOSIS — C61 Malignant neoplasm of prostate: Secondary | ICD-10-CM | POA: Diagnosis not present

## 2015-09-09 LAB — CBC
HEMATOCRIT: 39.7 % — AB (ref 40.0–52.0)
HEMOGLOBIN: 13.9 g/dL (ref 13.0–18.0)
MCH: 29.4 pg (ref 26.0–34.0)
MCHC: 35.1 g/dL (ref 32.0–36.0)
MCV: 83.7 fL (ref 80.0–100.0)
Platelets: 172 10*3/uL (ref 150–440)
RBC: 4.74 MIL/uL (ref 4.40–5.90)
RDW: 16.9 % — ABNORMAL HIGH (ref 11.5–14.5)
WBC: 4.1 10*3/uL (ref 3.8–10.6)

## 2015-09-12 ENCOUNTER — Ambulatory Visit
Admission: RE | Admit: 2015-09-12 | Discharge: 2015-09-12 | Disposition: A | Discharge status: Home / Self Care | Payer: BLUE CROSS/BLUE SHIELD | Source: Ambulatory Visit | Attending: Radiation Oncology | Admitting: Radiation Oncology

## 2015-09-12 DIAGNOSIS — C61 Malignant neoplasm of prostate: Secondary | ICD-10-CM | POA: Diagnosis not present

## 2015-09-14 ENCOUNTER — Ambulatory Visit
Admission: RE | Admit: 2015-09-14 | Discharge: 2015-09-14 | Disposition: A | Discharge status: Home / Self Care | Payer: BLUE CROSS/BLUE SHIELD | Source: Ambulatory Visit | Attending: Radiation Oncology | Admitting: Radiation Oncology

## 2015-09-14 DIAGNOSIS — C61 Malignant neoplasm of prostate: Secondary | ICD-10-CM | POA: Diagnosis not present

## 2015-09-15 ENCOUNTER — Ambulatory Visit
Admission: RE | Admit: 2015-09-15 | Discharge: 2015-09-15 | Disposition: A | Discharge status: Home / Self Care | Payer: BLUE CROSS/BLUE SHIELD | Source: Ambulatory Visit | Attending: Radiation Oncology | Admitting: Radiation Oncology

## 2015-09-15 DIAGNOSIS — C61 Malignant neoplasm of prostate: Secondary | ICD-10-CM | POA: Diagnosis not present

## 2015-09-16 ENCOUNTER — Ambulatory Visit
Admission: RE | Admit: 2015-09-16 | Discharge: 2015-09-16 | Disposition: A | Discharge status: Home / Self Care | Payer: BLUE CROSS/BLUE SHIELD | Source: Ambulatory Visit | Attending: Radiation Oncology | Admitting: Radiation Oncology

## 2015-09-16 DIAGNOSIS — C61 Malignant neoplasm of prostate: Secondary | ICD-10-CM | POA: Diagnosis not present

## 2015-09-19 ENCOUNTER — Ambulatory Visit
Admission: RE | Admit: 2015-09-19 | Discharge: 2015-09-19 | Disposition: A | Discharge status: Home / Self Care | Payer: BLUE CROSS/BLUE SHIELD | Source: Ambulatory Visit | Attending: Radiation Oncology | Admitting: Radiation Oncology

## 2015-09-19 ENCOUNTER — Telehealth: Payer: Self-pay | Admitting: Urology

## 2015-09-19 DIAGNOSIS — C61 Malignant neoplasm of prostate: Secondary | ICD-10-CM | POA: Diagnosis not present

## 2015-09-19 NOTE — Telephone Encounter (Signed)
Yes, as long as he is doing well, I'd like to see him 3 months after radiation is complete.    Hollice Espy, MD

## 2015-09-19 NOTE — Telephone Encounter (Signed)
Patient called the office this afternoon.  He is currently having daily radiation treatments at the [facility].  He is scheduled for a 3 month follow up with you on 7/12.  He is not having any urinary issues.  He wants to know if he has to keep this appointment or if he should wait until after radiation.  Please advise.

## 2015-09-20 ENCOUNTER — Ambulatory Visit
Admission: RE | Admit: 2015-09-20 | Discharge: 2015-09-20 | Disposition: A | Discharge status: Home / Self Care | Payer: BLUE CROSS/BLUE SHIELD | Source: Ambulatory Visit | Attending: Radiation Oncology | Admitting: Radiation Oncology

## 2015-09-20 DIAGNOSIS — C61 Malignant neoplasm of prostate: Secondary | ICD-10-CM | POA: Diagnosis not present

## 2015-09-21 ENCOUNTER — Ambulatory Visit: Payer: BLUE CROSS/BLUE SHIELD | Admitting: Urology

## 2015-09-21 ENCOUNTER — Ambulatory Visit
Admission: RE | Admit: 2015-09-21 | Discharge: 2015-09-21 | Disposition: A | Discharge status: Home / Self Care | Payer: BLUE CROSS/BLUE SHIELD | Source: Ambulatory Visit | Attending: Radiation Oncology | Admitting: Radiation Oncology

## 2015-09-21 DIAGNOSIS — C61 Malignant neoplasm of prostate: Secondary | ICD-10-CM | POA: Diagnosis not present

## 2015-09-21 NOTE — Telephone Encounter (Signed)
Spoke wit hpt and he will check his schd and call me back to schd his 3 month appt   michelle

## 2015-09-22 ENCOUNTER — Ambulatory Visit
Admission: RE | Admit: 2015-09-22 | Discharge: 2015-09-22 | Disposition: A | Discharge status: Home / Self Care | Payer: BLUE CROSS/BLUE SHIELD | Source: Ambulatory Visit | Attending: Radiation Oncology | Admitting: Radiation Oncology

## 2015-09-22 DIAGNOSIS — C61 Malignant neoplasm of prostate: Secondary | ICD-10-CM | POA: Diagnosis not present

## 2015-09-23 ENCOUNTER — Ambulatory Visit
Admission: RE | Admit: 2015-09-23 | Discharge: 2015-09-23 | Disposition: A | Discharge status: Home / Self Care | Payer: BLUE CROSS/BLUE SHIELD | Source: Ambulatory Visit | Attending: Radiation Oncology | Admitting: Radiation Oncology

## 2015-09-23 ENCOUNTER — Other Ambulatory Visit: Payer: Self-pay | Admitting: *Deleted

## 2015-09-23 DIAGNOSIS — C61 Malignant neoplasm of prostate: Secondary | ICD-10-CM

## 2015-09-26 ENCOUNTER — Ambulatory Visit
Admission: RE | Admit: 2015-09-26 | Discharge: 2015-09-26 | Disposition: A | Discharge status: Home / Self Care | Payer: BLUE CROSS/BLUE SHIELD | Source: Ambulatory Visit | Attending: Radiation Oncology | Admitting: Radiation Oncology

## 2015-09-26 DIAGNOSIS — C61 Malignant neoplasm of prostate: Secondary | ICD-10-CM | POA: Diagnosis not present

## 2015-09-27 ENCOUNTER — Ambulatory Visit
Admission: RE | Admit: 2015-09-27 | Discharge: 2015-09-27 | Disposition: A | Discharge status: Home / Self Care | Payer: BLUE CROSS/BLUE SHIELD | Source: Ambulatory Visit | Attending: Radiation Oncology | Admitting: Radiation Oncology

## 2015-09-27 ENCOUNTER — Ambulatory Visit: Payer: BLUE CROSS/BLUE SHIELD

## 2015-09-27 DIAGNOSIS — C61 Malignant neoplasm of prostate: Secondary | ICD-10-CM | POA: Diagnosis not present

## 2015-09-28 ENCOUNTER — Ambulatory Visit: Payer: BLUE CROSS/BLUE SHIELD

## 2015-09-28 ENCOUNTER — Ambulatory Visit
Admission: RE | Admit: 2015-09-28 | Discharge: 2015-09-28 | Disposition: A | Discharge status: Home / Self Care | Payer: BLUE CROSS/BLUE SHIELD | Source: Ambulatory Visit | Attending: Radiation Oncology | Admitting: Radiation Oncology

## 2015-09-28 ENCOUNTER — Inpatient Hospital Stay: Payer: BLUE CROSS/BLUE SHIELD | Attending: Radiation Oncology

## 2015-09-28 DIAGNOSIS — Z79818 Long term (current) use of other agents affecting estrogen receptors and estrogen levels: Secondary | ICD-10-CM | POA: Insufficient documentation

## 2015-09-28 DIAGNOSIS — C61 Malignant neoplasm of prostate: Secondary | ICD-10-CM | POA: Diagnosis not present

## 2015-09-28 MED ORDER — LEUPROLIDE ACETATE (4 MONTH) 30 MG IM KIT
30.0000 mg | PACK | Freq: Once | INTRAMUSCULAR | Status: AC
  Administered 2015-09-28: 30 mg via INTRAMUSCULAR
  Filled 2015-09-28: qty 30

## 2015-11-07 ENCOUNTER — Ambulatory Visit
Admission: RE | Admit: 2015-11-07 | Discharge: 2015-11-07 | Disposition: A | Discharge status: Home / Self Care | Payer: BLUE CROSS/BLUE SHIELD | Source: Ambulatory Visit | Attending: Radiation Oncology | Admitting: Radiation Oncology

## 2015-11-07 ENCOUNTER — Encounter: Payer: Self-pay | Admitting: Radiation Oncology

## 2015-11-07 VITALS — BP 137/88 | HR 93 | Temp 97.5°F | Resp 18 | Wt 211.1 lb

## 2015-11-07 DIAGNOSIS — Z923 Personal history of irradiation: Secondary | ICD-10-CM | POA: Diagnosis not present

## 2015-11-07 DIAGNOSIS — C61 Malignant neoplasm of prostate: Secondary | ICD-10-CM

## 2015-11-07 DIAGNOSIS — Z9079 Acquired absence of other genital organ(s): Secondary | ICD-10-CM | POA: Insufficient documentation

## 2015-11-07 DIAGNOSIS — R9721 Rising PSA following treatment for malignant neoplasm of prostate: Secondary | ICD-10-CM | POA: Insufficient documentation

## 2015-11-07 NOTE — Progress Notes (Signed)
Radiation Oncology Follow up Note  Name: Brad Patrick   Date:   11/07/2015 MRN:  [id number] DOB: 06-Jul-1955    This 60 y.o. male presents to the clinic today for one-month follow-up status post salvage radiation therapy for stage IIIa adenocarcinoma the prostate status post robotic prostatectomy with persistent disease by elevation of PSA and positive surgical margins.  REFERRING PROVIDER: [name], MD  HPI: Patient is a 60 year old male now out 1 month having completed IM RT radiation therapy salvage treatment for prostate cancer. He initially presented with stage IIIa disease (T3a N0 M0) with a PSA of 33 status post robotic prostatectomy with positive margins and persistent elevation of his PSA seen today in routine follow-up he is doing well. He specifically denies diarrhea dysuria or any other GI/GU complaints. He's currently on Lupron having some some hot flashes which I've suggested he takes some vitamin E supplements to help.  COMPLICATIONS OF TREATMENT: none  FOLLOW UP COMPLIANCE: keeps appointments   PHYSICAL EXAM:  BP 137/88   Pulse 93   Temp 97.5 F (36.4 C)   Resp 18   Wt 211 lb 1.5 oz (95.8 kg)   BMI 29.44 kg/m  On rectal exam rectal sphincter tone is good prostatic fossa is clear without evidence of nodularity or mass. Otherwise rectal exam is unremarkable. Well-developed well-nourished patient in NAD. HEENT reveals PERLA, EOMI, discs not visualized.  Oral cavity is clear. No oral mucosal lesions are identified. Neck is clear without evidence of cervical or supraclavicular adenopathy. Lungs are clear to A&P. Cardiac examination is essentially unremarkable with regular rate and rhythm without murmur rub or thrill. Abdomen is benign with no organomegaly or masses noted. Motor sensory and DTR levels are equal and symmetric in the upper and lower extremities. Cranial nerves II through XII are grossly intact. Proprioception is intact. No peripheral adenopathy or edema  is identified. No motor or sensory levels are noted. Crude visual fields are within normal range.  RADIOLOGY RESULTS: No current films for review  PLAN: Present time he is doing well post his radiation therapy with very little side effects or complaints. I've asked to see him back in 3-4 months and ordered a PSA prior to that visit. Patient knows to call sooner with any concerns. He will stay on Lupron suppression for at least a year and a half.  I would like to take this opportunity to thank you for allowing me to participate in the care of your patient.Armstead Peaks., MD

## 2016-01-02 ENCOUNTER — Telehealth: Payer: Self-pay | Admitting: Radiation Oncology

## 2016-01-02 NOTE — Telephone Encounter (Signed)
Pt has question for you about frequency of lupron injection. Please call him. Thanks.

## 2016-01-05 ENCOUNTER — Other Ambulatory Visit: Payer: Self-pay | Admitting: *Deleted

## 2016-01-05 DIAGNOSIS — C61 Malignant neoplasm of prostate: Secondary | ICD-10-CM

## 2016-01-20 ENCOUNTER — Ambulatory Visit (INDEPENDENT_AMBULATORY_CARE_PROVIDER_SITE_OTHER): Payer: BLUE CROSS/BLUE SHIELD | Admitting: Urology

## 2016-01-20 ENCOUNTER — Encounter: Payer: Self-pay | Admitting: Urology

## 2016-01-20 VITALS — BP 144/80 | Ht 71.0 in | Wt 222.5 lb

## 2016-01-20 DIAGNOSIS — N393 Stress incontinence (female) (male): Secondary | ICD-10-CM | POA: Diagnosis not present

## 2016-01-20 DIAGNOSIS — C61 Malignant neoplasm of prostate: Secondary | ICD-10-CM

## 2016-01-20 DIAGNOSIS — N5231 Erectile dysfunction following radical prostatectomy: Secondary | ICD-10-CM

## 2016-01-20 MED ORDER — SILDENAFIL CITRATE 20 MG PO TABS: 20.0000 mg | ORAL_TABLET | ORAL | 11 refills | Status: DC | PRN

## 2016-01-20 NOTE — Patient Instructions (Signed)
Discussed importance of bone health on ADT, recommend 1000-1200 mg daily calcium suppliment and 800-1000 IU vit D daily.  Also encouraged weight being exercises and cardiovascular health.  

## 2016-01-20 NOTE — Progress Notes (Signed)
10:56 AM  01/20/16  Charna Busman 1956-02-25 [id number]  Referring provider: [name], MD [facility] [place],  [address]  Chief Complaint  Patient presents with  . Follow-up    HPI:  60 year old male status post robot assisted thorascopic prostatectomy on 05/17/2015. He has since undergo salvage radiation therapy for adverse pathological features and PSA persistence (0.3, preop 33) and started on Lupron for 16 months by Dr. Baruch Gouty.   pT2a Gleason 4+3 prostate cancer + ECE + margin at bladder neck, apex, posterio- lateral (NV bundle), anterior No SV involvement  Negative LN  (0/3) bilaterally  His urinary continence is excellent, mild leakage with laugh, cough, and sneeze but only wearing one safety pad daily without being saturated.  Not doing Kegels.    No erections since surgery.  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 As such, he has not been particularly to sit and sexual activity.  He has been tolerating the Lupron fairly well with occasional hot flashes. He is not taking calcium or vitamin D. He continues routine weightbearing exercises.  PMH: Past Medical History:  Diagnosis Date  . Elevated PSA   . HLD (hyperlipidemia)   . HTN (hypertension)   . Prostate cancer [facility])     Surgical History: Past Surgical History:  Procedure Laterality Date  . HERNIA REPAIR    . PELVIC LYMPH NODE DISSECTION N/A 05/17/2015   Procedure: PELVIC LYMPH NODE DISSECTION;  Surgeon: Hollice Espy, MD;  Location: [facility] ORS;  Service: Urology;  Laterality: N/A;  . ROBOT ASSISTED LAPAROSCOPIC RADICAL PROSTATECTOMY N/A 05/17/2015   Procedure: ROBOTIC ASSISTED LAPAROSCOPIC RADICAL PROSTATECTOMY;  Surgeon: Hollice Espy, MD;  Location: [facility] ORS;  Service: Urology;  Laterality: N/A;    Home Medications:    Medication List       Accurate as of 01/20/16 10:56 AM.  Always use your most recent med list.          bisoprolol-hydrochlorothiazide 5-6.25 MG tablet Commonly known as:  ZIAC Take 1 tablet by mouth daily.   diazepam 2 MG tablet Commonly known as:  VALIUM Reported on 06/15/2015   docusate sodium 100 MG capsule Commonly known as:  COLACE Take 1 capsule (100 mg total) by mouth 2 (two) times daily.   gabapentin 300 MG capsule Commonly known as:  NEURONTIN   traMADol 50 MG tablet Commonly known as:  ULTRAM Take by mouth. Reported on 06/27/2015       Allergies: No Known Allergies  Family History: Family History  Problem Relation Age of Onset  . Kidney disease Neg Hx   . Prostate cancer Neg Hx     Social History:  reports that he has never smoked. His smokeless tobacco use includes Snuff. He reports that he drinks about 3.6 oz of alcohol per week . He reports that he does not use drugs.  ROS: UROLOGY Frequent Urination?: No Hard to postpone urination?: No Burning/pain with urination?: No Get up at night to urinate?: No Leakage of urine?: No Urine stream starts and stops?: No Trouble starting stream?: No Do you have to strain to urinate?: No Blood in urine?: No Urinary tract infection?: No Sexually transmitted disease?: No Injury to kidneys or bladder?: No Painful intercourse?: No Weak stream?: No Erection problems?: No Penile pain?: No  Gastrointestinal Nausea?: No Vomiting?: No Indigestion/heartburn?: No Diarrhea?: No Constipation?: No  Constitutional Fever: No Night sweats?: No Weight loss?: No  Fatigue?: No  Skin Skin rash/lesions?: No Itching?: No  Eyes Blurred vision?: No Double vision?: No  Ears/Nose/Throat Sore throat?: No Sinus problems?: No  Hematologic/Lymphatic Swollen glands?: No Easy bruising?: No  Cardiovascular Leg swelling?: No Chest pain?: No  Respiratory Cough?: No Shortness of breath?: No  Endocrine Excessive thirst?: No  Musculoskeletal Back pain?: No Joint pain?:  No  Neurological Headaches?: No Dizziness?: No  Psychologic Depression?: No Anxiety?: No  Physical Exam: BP (!) 144/80 (BP Location: Left Arm, Patient Position: Sitting, Cuff Size: Large)   Ht 5\' 11"  (1.803 m)   Wt 222 lb 8 oz (100.9 kg)   BMI 31.03 kg/m   Constitutional:  Alert and oriented, No acute distress.  HEENT: [place] AT, moist mucus membranes.  Trachea midline, no masses. Cardiovascular: No clubbing, cyanosis, or edema. Respiratory: Normal respiratory effort, no increased work of breathing. GI: Abdomen is soft, nontender,nondistended, no abdominal masses.    Skin: No rashes, bruises or suspicious lesions. Neurologic: Grossly intact, no focal deficits, moving all 4 extremities. Psychiatric: Normal mood and affect.  Laboratory Data: Lab Results  Component Value Date   WBC 4.1 09/09/2015   HGB 13.9 09/09/2015   HCT 39.7 (L) 09/09/2015   MCV 83.7 09/09/2015   PLT 172 09/09/2015    Lab Results  Component Value Date   CREATININE 0.83 05/30/2015   Component     Latest Ref Rng & Units 03/01/2015 06/13/2015 07/13/2015  PSA     0.0 - 4.0 ng/mL 33.2 (H) 0.4 0.3     Assessment & Plan:    1. Prostate cancer ([place]) T3a diease with +ECE, + margins.   PSA at 8 weeks detectable at 0.3 down from 33 concerning for persistent nsistent with residual pelvic diease.   S/p adjuvant radiation completed 09/2015 16 months Lupron per Dr. Baruch Gouty, due for follow up next month- discussed calcium/ vit D supplementation PSA/ Testerone today  2. SUI Minimal SUI, excellent result Encouraged Kegels  3. Erectile dysfunction Discussed penile rehabilitation. Has never previously tried PDE 5 inhibitors. Would recommend starting the Viagra 100 mg. Scripts into [employer] for generics. Side effects and use of medication discussed in detail. If he fails PDE5  inhibitors, we did go ahead and discuss intracavernosal injections today. He will let us know if he like to pursue this. Encouraged  penile stretching.   Return in about 6 months (around 07/19/2016) for PSA, symptoms recheck.  Hollice Espy, MD  [facility] [address] [place], [place] [address] [phone]

## 2016-01-21 LAB — PSA: Prostate Specific Ag, Serum: <0.1 ng/mL (ref 0.0–4.0)

## 2016-01-21 LAB — TESTOSTERONE

## 2016-01-23 ENCOUNTER — Telehealth: Payer: Self-pay

## 2016-01-23 NOTE — Telephone Encounter (Signed)
-----   Message from Hollice Espy, MD sent at 01/23/2016  7:19 AM EST ----- PSA undetectable as expected.  Great news.    Hollice Espy, MD

## 2016-01-23 NOTE — Telephone Encounter (Signed)
Spoke with pt in reference to PSA results. Pt voiced understanding.  

## 2016-02-15 ENCOUNTER — Encounter: Payer: Self-pay | Admitting: Radiation Oncology

## 2016-02-15 ENCOUNTER — Inpatient Hospital Stay: Payer: BLUE CROSS/BLUE SHIELD | Attending: Radiation Oncology

## 2016-02-15 ENCOUNTER — Other Ambulatory Visit: Payer: Self-pay | Admitting: *Deleted

## 2016-02-15 ENCOUNTER — Ambulatory Visit
Admission: RE | Admit: 2016-02-15 | Discharge: 2016-02-15 | Disposition: A | Discharge status: Home / Self Care | Payer: BLUE CROSS/BLUE SHIELD | Source: Ambulatory Visit | Attending: Radiation Oncology | Admitting: Radiation Oncology

## 2016-02-15 ENCOUNTER — Inpatient Hospital Stay: Payer: BLUE CROSS/BLUE SHIELD

## 2016-02-15 VITALS — BP 127/84 | HR 76 | Temp 95.5°F | Resp 18 | Wt 222.7 lb

## 2016-02-15 DIAGNOSIS — C61 Malignant neoplasm of prostate: Secondary | ICD-10-CM

## 2016-02-15 DIAGNOSIS — Z79818 Long term (current) use of other agents affecting estrogen receptors and estrogen levels: Secondary | ICD-10-CM | POA: Insufficient documentation

## 2016-02-15 MED ORDER — LEUPROLIDE ACETATE (4 MONTH) 30 MG IM KIT
30.0000 mg | PACK | Freq: Once | INTRAMUSCULAR | Status: AC
  Administered 2016-02-15: 30 mg via INTRAMUSCULAR
  Filled 2016-02-15: qty 30

## 2016-02-15 NOTE — Progress Notes (Signed)
Radiation Oncology Follow up Note  Name: Brad Patrick   Date:   02/15/2016 MRN:  [id number] DOB: 08-27-1955    This 60 y.o. 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  REFERRING PROVIDER: [name], MD  HPI: Patient is a 60 year old male now out 4 months having completed salvage I am RT radiation therapy for a stage IIIa (T3a N0 M0) adenocarcinoma the prostate presenting the PSA of 33. He was status post robotic prostatectomy with positive margins and persistent elevation of his PSA. He is seen today in routine follow-up and is doing well. He specifically denies diarrhea dysuria or any other GI/GU complaints he had nocturia 1. His most recent PSA was less than 0.1 performed on 01/20/2016. He continues on Lupron suppression therapy..  COMPLICATIONS OF TREATMENT: none  FOLLOW UP COMPLIANCE: keeps appointments   PHYSICAL EXAM:  BP 127/84   Pulse 76   Temp (!) 95.5 F (35.3 C)   Resp 18   Wt 222 lb 10.6 oz (101 kg)   BMI 31.06 kg/m  On rectal exam rectal sphincter tone is good prostatic fossa is clear without evidence of nodularity or mass. No other rectal abnormalities identified. Well-developed well-nourished patient in NAD. HEENT reveals PERLA, EOMI, discs not visualized.  Oral cavity is clear. No oral mucosal lesions are identified. Neck is clear without evidence of cervical or supraclavicular adenopathy. Lungs are clear to A&P. Cardiac examination is essentially unremarkable with regular rate and rhythm without murmur rub or thrill. Abdomen is benign with no organomegaly or masses noted. Motor sensory and DTR levels are equal and symmetric in the upper and lower extremities. Cranial nerves II through XII are grossly intact. Proprioception is intact. No peripheral adenopathy or edema is identified. No motor or sensory levels are noted. Crude visual  fields are within normal range.  RADIOLOGY RESULTS: No current films for review  PLAN: At the present time he is doing well. Will keep him on Lupron suppression for about a year and a half and he has another Lupron injection today. Otherwise I'm please was overall progress and his PSA results. I've asked to see him back in 6 months for follow-up. She knows to call sooner with any concerns.  I would like to take this opportunity to thank you for allowing me to participate in the care of your patient.Armstead Peaks., MD

## 2016-02-22 ENCOUNTER — Ambulatory Visit: Payer: Self-pay | Admitting: Radiation Oncology

## 2016-05-04 ENCOUNTER — Encounter: Payer: Self-pay | Admitting: *Deleted

## 2016-06-15 ENCOUNTER — Inpatient Hospital Stay: Payer: BLUE CROSS/BLUE SHIELD

## 2016-06-18 ENCOUNTER — Other Ambulatory Visit: Payer: Self-pay | Admitting: *Deleted

## 2016-06-18 DIAGNOSIS — C61 Malignant neoplasm of prostate: Secondary | ICD-10-CM

## 2016-06-18 MED ORDER — LEUPROLIDE ACETATE (4 MONTH) 30 MG IM KIT: 30.0000 mg | PACK | Freq: Once | INTRAMUSCULAR | Status: AC

## 2016-06-19 ENCOUNTER — Inpatient Hospital Stay: Payer: BLUE CROSS/BLUE SHIELD | Attending: Radiation Oncology

## 2016-06-19 ENCOUNTER — Other Ambulatory Visit: Payer: Self-pay | Admitting: *Deleted

## 2016-06-19 DIAGNOSIS — Z79818 Long term (current) use of other agents affecting estrogen receptors and estrogen levels: Secondary | ICD-10-CM | POA: Diagnosis not present

## 2016-06-19 DIAGNOSIS — C61 Malignant neoplasm of prostate: Secondary | ICD-10-CM

## 2016-06-19 MED ORDER — LEUPROLIDE ACETATE (4 MONTH) 30 MG IM KIT
30.0000 mg | PACK | Freq: Once | INTRAMUSCULAR | Status: AC
  Administered 2016-06-19: 30 mg via INTRAMUSCULAR
  Filled 2016-06-19: qty 30

## 2016-07-20 ENCOUNTER — Ambulatory Visit (INDEPENDENT_AMBULATORY_CARE_PROVIDER_SITE_OTHER): Payer: BLUE CROSS/BLUE SHIELD | Admitting: Urology

## 2016-07-20 ENCOUNTER — Encounter: Payer: Self-pay | Admitting: Urology

## 2016-07-20 VITALS — BP 150/91 | HR 87 | Ht 68.0 in | Wt 212.0 lb

## 2016-07-20 DIAGNOSIS — C61 Malignant neoplasm of prostate: Secondary | ICD-10-CM | POA: Diagnosis not present

## 2016-07-20 NOTE — Progress Notes (Signed)
11:08 AM  07/20/16  Brad Patrick October 04, 1955 [id number]  Referring provider: [name], MD [facility] [place], [place] [address]  Chief Complaint  Patient presents with  . Prostate Cancer    HPI: 61 year old male status post robot assisted thorascopic prostatectomy on 05/17/2015. He has since undergo salvage radiation therapy for adverse pathological features and PSA persistence (0.3, preop 33) and started on Lupron for 16 months by Dr. Baruch Gouty.   pT2a Gleason 4+3 prostate cancer + ECE + margin at bladder neck, apex, posterio- lateral (NV bundle), anterior No SV involvement  Negative LN  (0/3) bilaterally  His urinary continence is excellent, mild leakage with laugh, cough, and sneeze but only wearing one safety pad daily without being saturated.  Not doing Kegels.  Overall satisfied with current urinary leakage.  No erections since surgery.  He was prescribed Viagra last visit but has not used it yet. He reports that he has no interest in sexual activity at this time.  He has been tolerating the Lupron fairly well with occasional hot flashes. He is not taking calcium or vitamin D. He continues routine weightbearing exercises. He did have a bone density test  for by his PCP which was 07/2016 which was unremarkable.  His most recent PSA on 06/2016 drawn by his PCP remains undetectable.  PMH: Past Medical History:  Diagnosis Date  . Elevated PSA   . HLD (hyperlipidemia)   . HTN (hypertension)   . Prostate cancer [facility])     Surgical History: Past Surgical History:  Procedure Laterality Date  . HERNIA REPAIR    . PELVIC LYMPH NODE DISSECTION N/A 05/17/2015   Procedure: PELVIC LYMPH NODE DISSECTION;  Surgeon: Hollice Espy, MD;  Location: [facility] ORS;  Service: Urology;  Laterality: N/A;  . ROBOT ASSISTED LAPAROSCOPIC RADICAL PROSTATECTOMY N/A 05/17/2015   Procedure: ROBOTIC ASSISTED LAPAROSCOPIC RADICAL PROSTATECTOMY;  Surgeon: Hollice Espy, MD;  Location: [facility] ORS;  Service: Urology;  Laterality: N/A;    Home Medications:  Allergies as of 07/20/2016   No Known Allergies     Medication List       Accurate as of 07/20/16 11:08 AM. Always use your most recent med list.          bisoprolol-hydrochlorothiazide 5-6.25 MG tablet Commonly known as:  ZIAC Take 1 tablet by mouth daily.       Allergies: No Known Allergies  Family History: Family History  Problem Relation Age of Onset  . Kidney disease Neg Hx   . Prostate cancer Neg Hx     Social History:  reports that he has never smoked. His smokeless tobacco use includes Snuff. He reports that he drinks about 3.6 oz of alcohol per week . He reports that he does not use drugs.  ROS: UROLOGY Frequent Urination?: No Hard to postpone urination?: No Burning/pain with urination?: No Get up at night to urinate?: No Leakage of urine?: No Urine stream starts and stops?: No Trouble starting stream?: No Do you have to strain to urinate?: No Blood in urine?: No Urinary tract infection?: No Sexually transmitted disease?: No Injury to kidneys or bladder?: No Painful intercourse?: No Weak stream?: No Erection problems?: Yes Penile pain?: No  Gastrointestinal Nausea?: No Vomiting?: No Indigestion/heartburn?: No Diarrhea?: No Constipation?: No  Constitutional Fever: No Night sweats?: Yes Weight loss?: No Fatigue?: No  Skin Skin rash/lesions?: No Itching?: No  Eyes Blurred vision?: No Double vision?: No  Ears/Nose/Throat Sore throat?: No Sinus problems?: No  Hematologic/Lymphatic Swollen glands?: No Easy bruising?: No  Cardiovascular Leg swelling?: No Chest pain?: No  Respiratory Cough?: No Shortness of breath?: No  Endocrine Excessive thirst?: No  Musculoskeletal Back pain?: No Joint pain?: No  Neurological Headaches?: No Dizziness?: No  Psychologic Depression?: No Anxiety?: No  Physical Exam: BP (!) 150/91   Pulse  87   Ht 5\' 8"  (1.727 m)   Wt 212 lb (96.2 kg)   BMI 32.23 kg/m   Constitutional:  Alert and oriented, No acute distress.  HEENT: [place] AT, moist mucus membranes.  Trachea midline, no masses. Cardiovascular: No clubbing, cyanosis, or edema. Respiratory: Normal respiratory effort, no increased work of breathing. GI: Abdomen is soft, nontender,nondistended, no abdominal masses.    Skin: No rashes, bruises or suspicious lesions. Neurologic: Grossly intact, no focal deficits, moving all 4 extremities. Psychiatric: Normal mood and affect.  Laboratory Data: Lab Results  Component Value Date   WBC 4.1 09/09/2015   HGB 13.9 09/09/2015   HCT 39.7 (L) 09/09/2015   MCV 83.7 09/09/2015   PLT 172 09/09/2015    Lab Results  Component Value Date   CREATININE 0.83 05/30/2015   Component     Latest Ref Rng & Units 03/01/2015 06/13/2015 07/13/2015  PSA     0.0 - 4.0 ng/mL 33.2 (H) 0.4 0.3   PSA less than 0.01 on 06/27/2016    Assessment & Plan:    1. Prostate cancer ([place]) T3a diease with +ECE, + margins.   PSA at 8 weeks detectable at 0.3 down from 33 concerning for persistent nsistent with residual pelvic diease.   S/p adjuvant radiation completed 09/2015 16 months Lupron per Dr. Baruch Gouty, last dose 06/2016 Discussed calcium/ vit D supplementation Recommend PSA every 6 months at minimum  2. SUI Minimal SUI, excellent result Encouraged Kegels  3. Erectile dysfunction Current lack of libido secondary to Lupron Not currently interested in pursuing further intervention for this at this time   Return in about 6 months (around 01/20/2017) for PSA.  Hollice Espy, MD  [facility] [address], [place], [place] [address] [phone]

## 2016-07-20 NOTE — Patient Instructions (Signed)
Discussed importance of bone health on ADT, recommend 1000-1200 mg daily calcium suppliment and 800-1000 IU vit D daily.  Also encouraged weight being exercises and cardiovascular health.  

## 2016-08-30 ENCOUNTER — Ambulatory Visit: Payer: BLUE CROSS/BLUE SHIELD | Admitting: Radiation Oncology

## 2016-09-06 ENCOUNTER — Ambulatory Visit
Admission: RE | Admit: 2016-09-06 | Discharge: 2016-09-06 | Disposition: A | Discharge status: Home / Self Care | Payer: BLUE CROSS/BLUE SHIELD | Source: Ambulatory Visit | Attending: Radiation Oncology | Admitting: Radiation Oncology

## 2016-09-06 ENCOUNTER — Encounter: Payer: Self-pay | Admitting: Radiation Oncology

## 2016-09-06 VITALS — BP 144/90 | HR 76 | Temp 97.6°F | Resp 20 | Wt 217.5 lb

## 2016-09-06 DIAGNOSIS — C61 Malignant neoplasm of prostate: Secondary | ICD-10-CM | POA: Diagnosis present

## 2016-09-06 DIAGNOSIS — Z9079 Acquired absence of other genital organ(s): Secondary | ICD-10-CM | POA: Insufficient documentation

## 2016-09-06 DIAGNOSIS — Z923 Personal history of irradiation: Secondary | ICD-10-CM | POA: Diagnosis not present

## 2016-09-06 NOTE — Progress Notes (Signed)
Radiation Oncology Follow up Note  Name: Brad Patrick   Date:   09/06/2016 MRN:  [id number] DOB: [birth year]    This 61 y.o. male presents to the clinic today for 1 year follow-up status post salvage radiation therapy for adenocarcinoma the prostate status post radical prostatectomy currently with no evidence of disease by PSA.  REFERRING PROVIDER: [name], MD  HPI: Patient is a 61 year old male now out 1 year having completed salvage radiation therapy to his prostatic fossa for recurrent adenocarcinoma the prostate status post radical prostatectomy. Patient had a stage IIIa adenocarcinoma with positive margins and had persistent elevation of his PSA. He underwent salvage radiation therapy 1 year out and is now doing well. His most recent PSA back in April 2018 was less than 0.01. He is currently on Lupron suppression. With his last dose back in April 2018. He specifically denies any increased lower urinary tract symptoms or diarrhea.  COMPLICATIONS OF TREATMENT: none  FOLLOW UP COMPLIANCE: keeps appointments   PHYSICAL EXAM:  BP (!) 144/90   Pulse 76   Temp 97.6 F (36.4 C)   Resp 20   Wt 217 lb 7.7 oz (98.6 kg)   BMI 33.07 kg/m  On rectal exam rectal sphincter tone is good prostatic fossa is clear without evidence of nodularity or mass. Otherwise rectal exam is unremarkable. Well-developed well-nourished patient in NAD. HEENT reveals PERLA, EOMI, discs not visualized.  Oral cavity is clear. No oral mucosal lesions are identified. Neck is clear without evidence of cervical or supraclavicular adenopathy. Lungs are clear to A&P. Cardiac examination is essentially unremarkable with regular rate and rhythm without murmur rub or thrill. Abdomen is benign with no organomegaly or masses noted. Motor sensory and DTR levels are equal and symmetric in the upper and lower extremities. Cranial nerves II through XII are grossly intact. Proprioception is intact. No peripheral adenopathy or  edema is identified. No motor or sensory levels are noted. Crude visual fields are within normal range.  RADIOLOGY RESULTS: No current films for review  PLAN: At the present time patient is doing well with no evidence of disease under excellent biochemical control of his recurrent prostate cancer. He continues close follow-up care with urology. I've asked to see him back in 1 year for follow-up. Patient is to call sooner with any concerns.  I would like to take this opportunity to thank you for allowing me to participate in the care of your patient.Armstead Peaks., MD

## 2017-01-21 ENCOUNTER — Other Ambulatory Visit: Payer: BLUE CROSS/BLUE SHIELD

## 2017-01-23 ENCOUNTER — Ambulatory Visit: Payer: BLUE CROSS/BLUE SHIELD | Admitting: Urology

## 2017-02-05 ENCOUNTER — Other Ambulatory Visit: Payer: Self-pay

## 2017-02-05 ENCOUNTER — Other Ambulatory Visit: Payer: BLUE CROSS/BLUE SHIELD

## 2017-02-05 DIAGNOSIS — C61 Malignant neoplasm of prostate: Secondary | ICD-10-CM

## 2017-02-06 LAB — PSA

## 2017-02-07 ENCOUNTER — Encounter: Payer: Self-pay | Admitting: Urology

## 2017-02-07 ENCOUNTER — Ambulatory Visit (INDEPENDENT_AMBULATORY_CARE_PROVIDER_SITE_OTHER): Payer: BLUE CROSS/BLUE SHIELD | Admitting: Urology

## 2017-02-07 VITALS — BP 128/74 | HR 98 | Ht 71.0 in | Wt 220.0 lb

## 2017-02-07 DIAGNOSIS — N393 Stress incontinence (female) (male): Secondary | ICD-10-CM | POA: Diagnosis not present

## 2017-02-07 DIAGNOSIS — N5231 Erectile dysfunction following radical prostatectomy: Secondary | ICD-10-CM | POA: Diagnosis not present

## 2017-02-07 DIAGNOSIS — C61 Malignant neoplasm of prostate: Secondary | ICD-10-CM | POA: Diagnosis not present

## 2017-02-07 NOTE — Progress Notes (Signed)
11:47 AM  02/14/17  Brad Patrick [birth year] [id number]  Referring provider: [name], MD [place] [facility] [place], [place] [address]  Chief Complaint  Patient presents with  . Elevated PSA    76month    HPI: 61 year old male status post robot assisted thorascopic prostatectomy on 05/17/2015. He has since undergo salvage radiation therapy for adverse pathological features and PSA persistence (0.3, preop 33) and started on Lupron for 16 months by Dr. Baruch Gouty.   pT2a Gleason 4+3 prostate cancer + ECE + margin at bladder neck, apex, posterio- lateral (NV bundle), anterior No SV involvement  Negative LN  (0/3) bilaterally  His urinary continence is excellent, mild leakage with laugh, cough, and sneeze but only wearing one safety pad daily without being saturated.  Not doing Kegels.  Overall satisfied with current urinary leakage. No change from last visit.    No erections since surgery.  He was prescribed Viagra last visit but has not used it yet. He reports that he has no interest in sexual activity at this time.  His last 73-month Lupron injection was given on 06/19/2016.  His hot flashes are decreasing significantly in the interval.  He continues to have very low libido.  His most recent PSA on 01/2017 remains undetectable.  PMH: Past Medical History:  Diagnosis Date  . Elevated PSA   . HLD (hyperlipidemia)   . HTN (hypertension)   . Prostate cancer [facility])     Surgical History: Past Surgical History:  Procedure Laterality Date  . HERNIA REPAIR    . PELVIC LYMPH NODE DISSECTION N/A 05/17/2015   Procedure: PELVIC LYMPH NODE DISSECTION;  Surgeon: Hollice Espy, MD;  Location: [facility] ORS;  Service: Urology;  Laterality: N/A;  . ROBOT ASSISTED LAPAROSCOPIC RADICAL PROSTATECTOMY N/A 05/17/2015   Procedure: ROBOTIC ASSISTED LAPAROSCOPIC RADICAL PROSTATECTOMY;  Surgeon: Hollice Espy, MD;  Location: [facility] ORS;  Service: Urology;  Laterality: N/A;      Home Medications:  Allergies as of 02/07/2017   No Known Allergies     Medication List        Accurate as of 02/07/17 11:59 PM. Always use your most recent med list.          bisoprolol-hydrochlorothiazide 5-6.25 MG tablet Commonly known as:  ZIAC Take 1 tablet by mouth daily.   leuprolide 30 MG injection Commonly known as:  LUPRON Inject 30 mg into the muscle every 4 (four) months.       Allergies: No Known Allergies  Family History: Family History  Problem Relation Age of Onset  . Kidney disease Neg Hx   . Prostate cancer Neg Hx     Social History:  reports that  has never smoked. His smokeless tobacco use includes snuff. He reports that he drinks about 3.6 oz of alcohol per week. He reports that he does not use drugs.  ROS: UROLOGY Frequent Urination?: No Hard to postpone urination?: No Burning/pain with urination?: No Get up at night to urinate?: Yes Leakage of urine?: No Urine stream starts and stops?: No Trouble starting stream?: No Do you have to strain to urinate?: No Blood in urine?: No Urinary tract infection?: No Sexually transmitted disease?: No Injury to kidneys or bladder?: No Painful intercourse?: No Weak stream?: No Erection problems?: Yes Penile pain?: No  Gastrointestinal Nausea?: No Vomiting?: No Indigestion/heartburn?: No Diarrhea?: No Constipation?: No  Constitutional Fever: No Night sweats?: No Weight loss?: No Fatigue?: No  Skin Skin rash/lesions?: No Itching?: No  Eyes Blurred  vision?: No Double vision?: No  Ears/Nose/Throat Sore throat?: No Sinus problems?: No  Hematologic/Lymphatic Swollen glands?: No Easy bruising?: No  Cardiovascular Leg swelling?: No Chest pain?: No  Respiratory Cough?: No Shortness of breath?: No  Endocrine Excessive thirst?: No  Musculoskeletal Back pain?: No Joint pain?: No  Neurological Headaches?: No Dizziness?: No  Psychologic Depression?: No Anxiety?:  No  Physical Exam: BP 128/74   Pulse 98   Ht 5\' 11"  (1.803 m)   Wt 220 lb (99.8 kg)   BMI 30.68 kg/m   Constitutional:  Alert and oriented, No acute distress.  HEENT: [place] AT, moist mucus membranes.  Trachea midline, no masses. Cardiovascular: No clubbing, cyanosis, or edema. Respiratory: Normal respiratory effort, no increased work of breathing. GI: Abdomen is soft, nontender,nondistended, no abdominal masses.    Skin: No rashes, bruises or suspicious lesions. Neurologic: Grossly intact, no focal deficits, moving all 4 extremities. Psychiatric: Normal mood and affect.  Laboratory Data: Lab Results  Component Value Date   WBC 4.1 09/09/2015   HGB 13.9 09/09/2015   HCT 39.7 (L) 09/09/2015   MCV 83.7 09/09/2015   PLT 172 09/09/2015    Lab Results  Component Value Date   CREATININE 0.83 05/30/2015   Component     Latest Ref Rng & Units 03/01/2015 06/13/2015 07/13/2015 01/20/2016  Prostate Specific Ag, Serum     0.0 - 4.0 ng/mL 33.2 (H) 0.4 0.3 <0.1   Component     Latest Ref Rng & Units 02/05/2017  Prostate Specific Ag, Serum     0.0 - 4.0 ng/mL <0.1    Assessment & Plan:    1. Prostate cancer ([place]) T3a diease with +ECE, + margins.   PSA at 8 weeks detectable at 0.3 down from 33 concerning for persistent nsistent with residual pelvic diease.   S/p adjuvant radiation completed 09/2015 16 months Lupron per Dr. Baruch Gouty, last dose 06/2016 Discussed calcium/ vit D supplementation Recommend PSA every 6 months at minimum  2. SUI Minimal SUI, excellent result Encouraged Kegels  3. Erectile dysfunction Current lack of libido secondary to Lupron Not currently interested in pursuing further intervention for this at this time We discussed the natural history of recovery of testicular function/testosterone As his testosterone coverage, his interest may progress We briefly discussed alternatives including sildenafil versus intracavernosal injections Let us know if he is  interested in pursuing intracavernosal injections in the future  Return for PSA only in 6 months/ MD in 12 months with PSA.  Hollice Espy, MD  [facility] [address], [place], [place] [address] [phone]

## 2017-02-14 ENCOUNTER — Encounter: Payer: Self-pay | Admitting: Urology

## 2017-04-02 ENCOUNTER — Ambulatory Visit
Admission: EM | Admit: 2017-04-02 | Discharge: 2017-04-02 | Disposition: A | Discharge status: Home / Self Care | Payer: Worker's Compensation | Attending: Family Medicine | Admitting: Family Medicine

## 2017-04-02 ENCOUNTER — Encounter: Payer: Self-pay | Admitting: *Deleted

## 2017-04-02 DIAGNOSIS — S161XXA Strain of muscle, fascia and tendon at neck level, initial encounter: Secondary | ICD-10-CM

## 2017-04-02 DIAGNOSIS — M549 Dorsalgia, unspecified: Secondary | ICD-10-CM

## 2017-04-02 MED ORDER — CYCLOBENZAPRINE HCL 10 MG PO TABS: 10.0000 mg | ORAL_TABLET | Freq: Every day | ORAL | 0 refills | Status: DC
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
## 2017-04-05 ENCOUNTER — Telehealth: Payer: Self-pay | Admitting: *Deleted

## 2017-07-18 IMAGING — MR MR CERVICAL SPINE W/O CM
5 series · 33 of 48 positions shown · non-contrast
Comparison: Whole-body bone scan 04/11/2015.

CLINICAL DATA: Left neck, hand and arm pain for 2 weeks since
prostate surgery. History of prostate cancer. Initial encounter.

EXAM:
MRI CERVICAL SPINE WITHOUT CONTRAST
TECHNIQUE: Multiplanar, multisequence MR imaging of the cervical spine was
performed. No intravenous contrast was administered.

[Series 2: T2 · sagittal · 3.0mm · 0.56mm/px · 6 of 13 slices shown (1 of 3)]
[im 1/13]
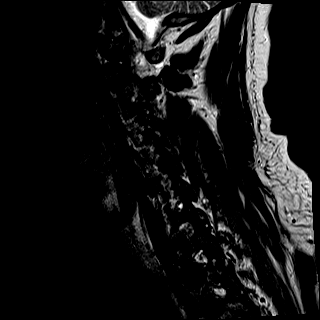
[im 3/13]
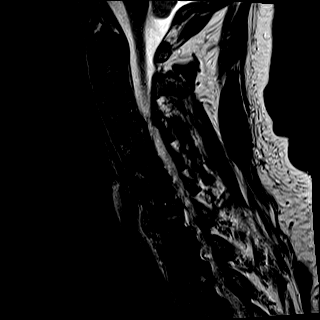
[im 5/13]
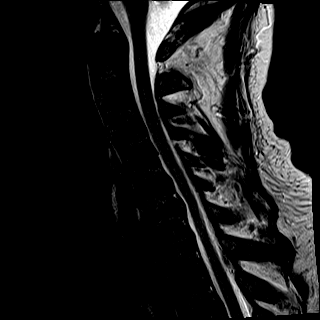
[im 8/13]
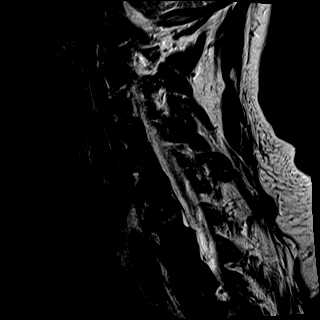
[im 10/13]
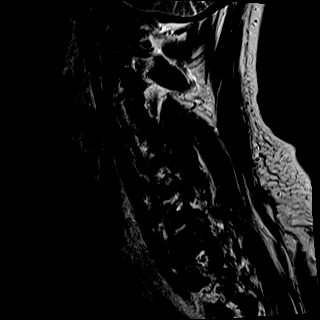
[im 13/13]
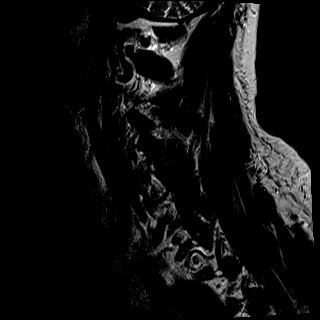

[Series 3: T1 · sagittal · 3.0mm · 0.70mm/px · 7 of 13 slices shown]
[im 1/13]
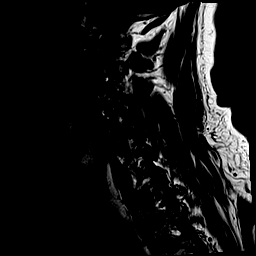
[im 3/13]
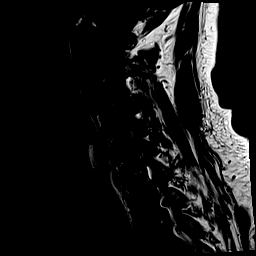
[im 5/13]
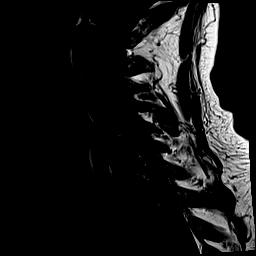
[im 7/13]
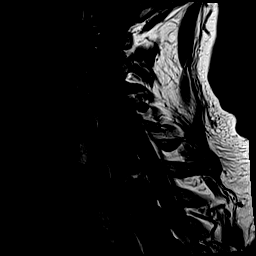
[im 9/13]
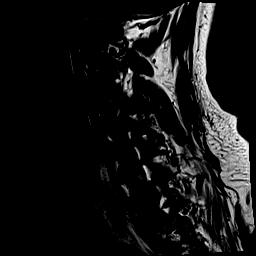
[im 11/13]
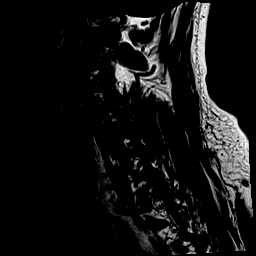
[im 13/13]
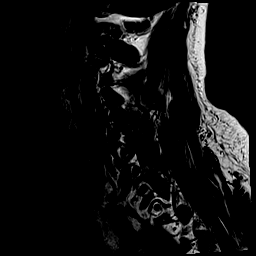

[Series 5: T2 · axial · 3.0mm · 0.62mm/px · z∈[-84,+10]mm · 8 of 27 slices shown (2 of 3)]
[im 1/27]
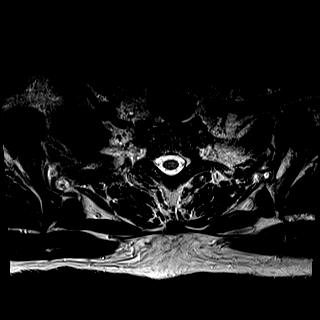
[im 5/27]
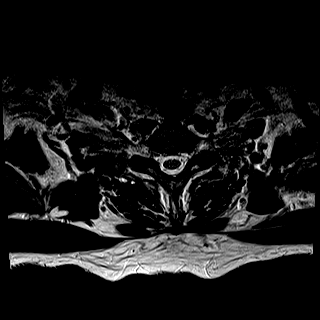
[im 9/27]
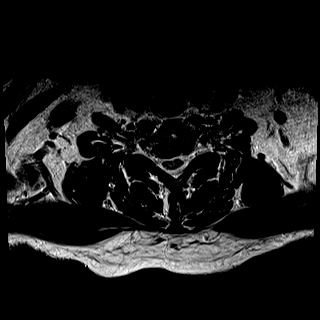
[im 13/27]
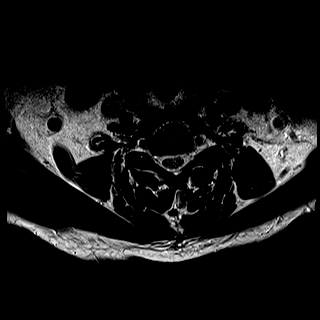
[im 15/27]
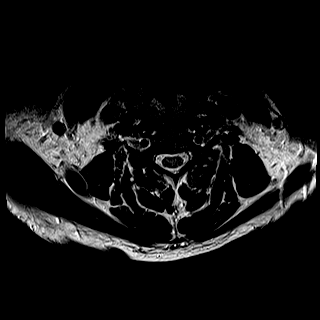
[im 19/27]
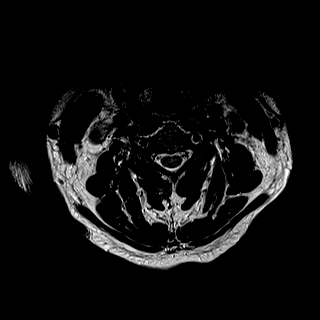
[im 23/27]
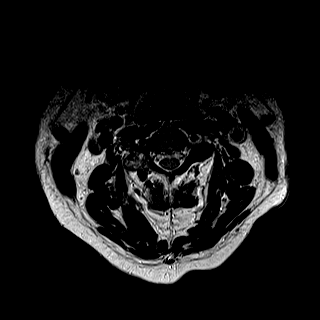
[im 27/27]
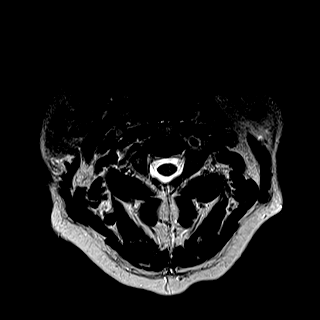

[Series 6: mpgr ax · axial · 3.0mm · 0.35mm/px · z∈[-71,-21]mm · 5 of 27 slices shown]
[im 1/27]
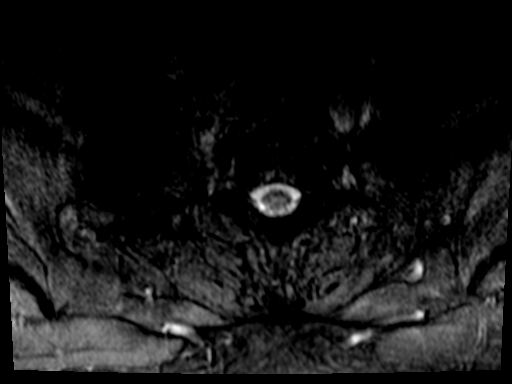
[im 5/27]
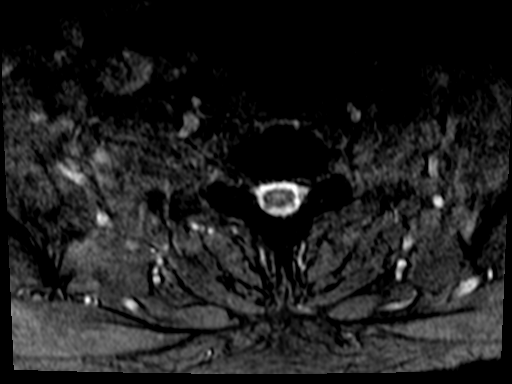
[im 9/27]
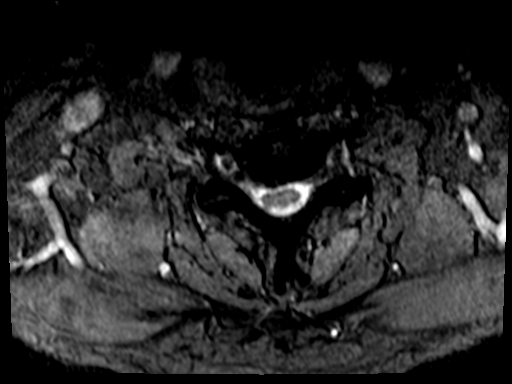
[im 13/27]
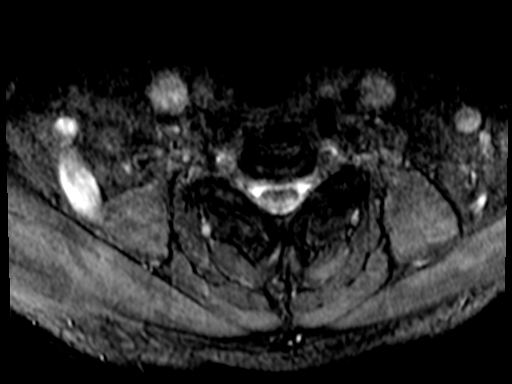
[im 15/27]
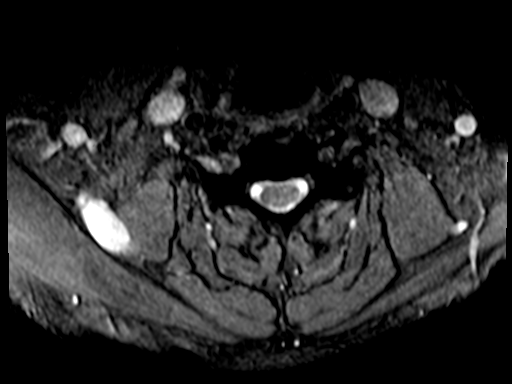

[Series 100: T2 · sagittal · 3.0mm · 0.35mm/px · 7 of 13 slices shown (3 of 3)]
[im 1/13]
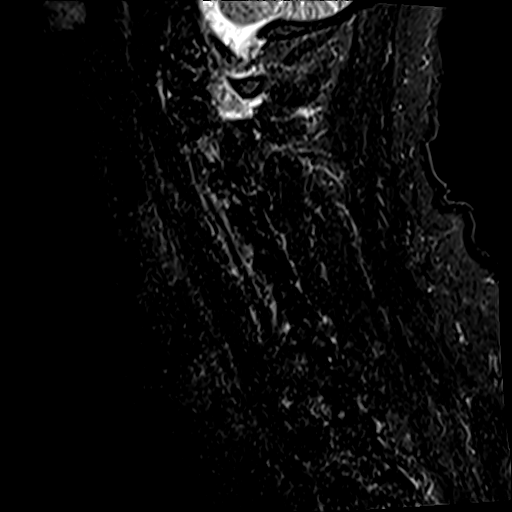
[im 3/13]
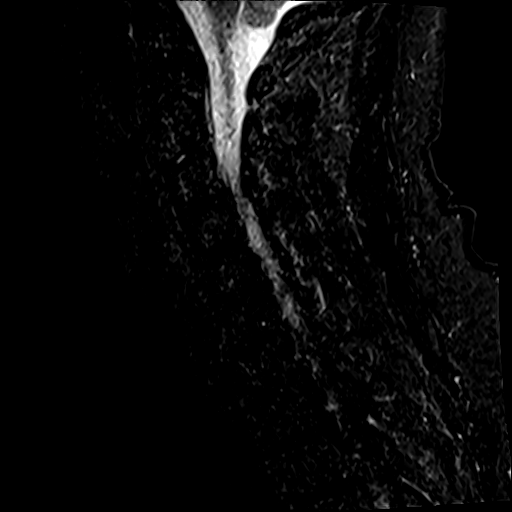
[im 5/13]
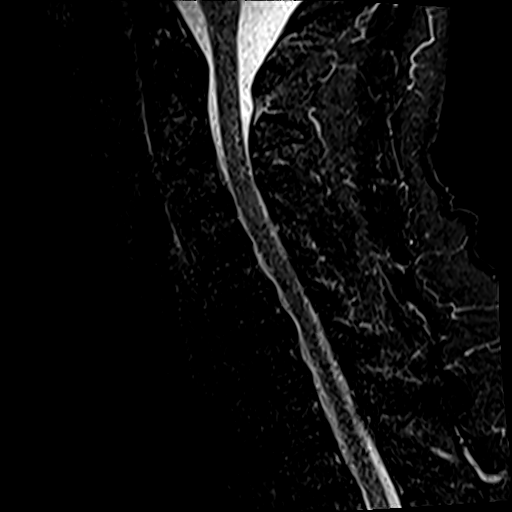
[im 7/13]
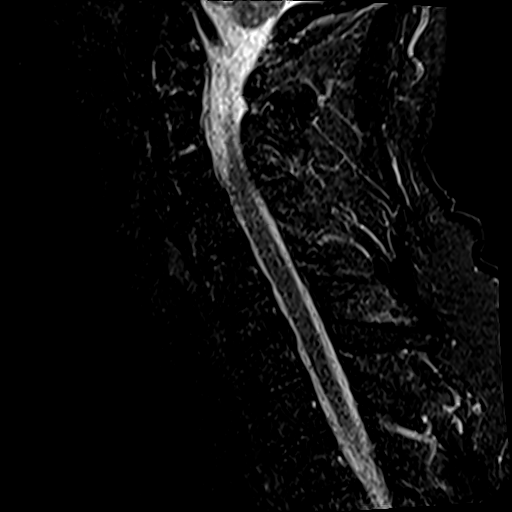
[im 9/13]
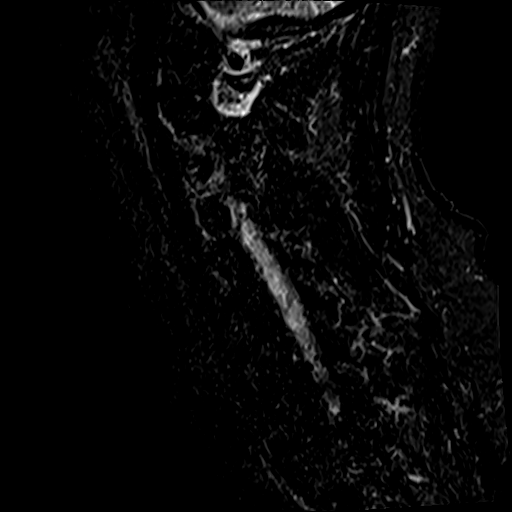
[im 11/13]
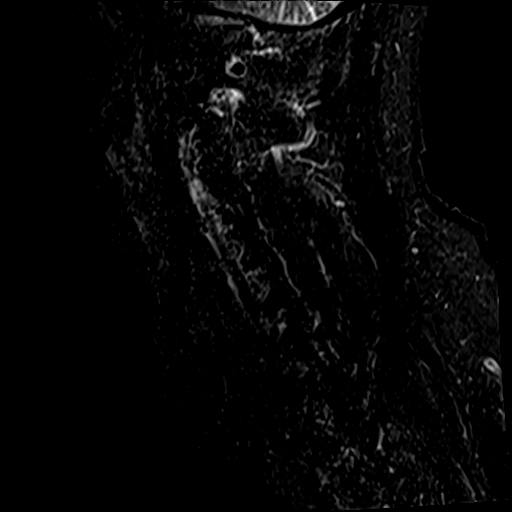
[im 13/13]
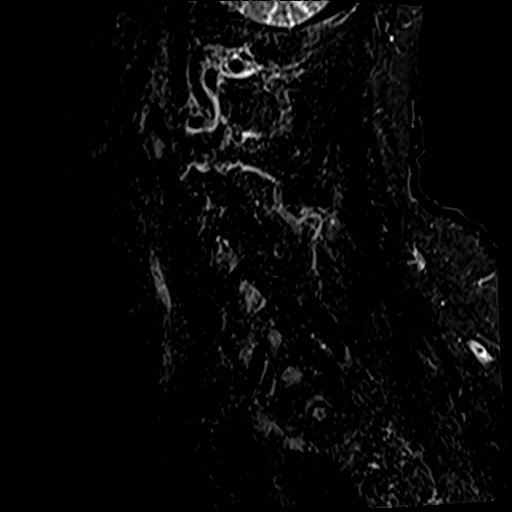

[33 of 48 positions shown; findings below may reference images not displayed]

FINDINGS: There is straightening of the normal cervical lordosis. Vertebral
body height and alignment are maintained. There is some degenerative
endplate signal change at C6-7. The craniocervical junction is
normal and cervical cord signal is normal. Imaged paraspinous
structures appear normal.

C2-3: Facet degenerative change is more notable on the right. No
disc bulge or protrusion. The central canal and foramina are widely
patent.

C3-4: Right worse than left facet degenerative change with mild
posterior bony ridging and uncovertebral disease. Mild to moderate
foraminal narrowing is more notable on the right. The central canal
is open.

C4-5: Minimal disc bulge without central canal or foraminal
narrowing.

C5-6: Shallow disc bulge and uncovertebral disease on the right.
Moderate right foraminal narrowing is seen. The central canal and
left foramen are patent.

C6-7: Shallow right paracentral protrusion with an annular fissure.
The central canal and foramina are widely patent.

C7-T1:  Facet degenerative disease.  Otherwise negative.
IMPRESSION: Negative for metastatic disease.

Overall mild spondylosis most notable at C3-4 where there is mild to
moderate bilateral foraminal narrowing, worse on the right.

## 2017-08-07 ENCOUNTER — Other Ambulatory Visit: Payer: BLUE CROSS/BLUE SHIELD

## 2017-08-07 ENCOUNTER — Other Ambulatory Visit: Payer: Self-pay | Admitting: Family Medicine

## 2017-08-07 DIAGNOSIS — C61 Malignant neoplasm of prostate: Secondary | ICD-10-CM

## 2017-08-08 ENCOUNTER — Telehealth: Payer: Self-pay

## 2017-08-08 LAB — PSA

## 2017-08-08 NOTE — Telephone Encounter (Signed)
-----   Message from Hollice Espy, MD sent at 08/08/2017  7:54 AM EDT ----- PSA remains undetectable.  Awesome news.  Hollice Espy, MD

## 2017-08-08 NOTE — Telephone Encounter (Signed)
Called pt informed him of information below, pt gave verbal understanding.

## 2017-09-06 ENCOUNTER — Ambulatory Visit: Payer: BLUE CROSS/BLUE SHIELD | Admitting: Radiation Oncology

## 2017-09-25 ENCOUNTER — Other Ambulatory Visit: Payer: Self-pay

## 2017-09-25 ENCOUNTER — Ambulatory Visit
Admission: RE | Admit: 2017-09-25 | Discharge: 2017-09-25 | Disposition: A | Discharge status: Home / Self Care | Payer: BLUE CROSS/BLUE SHIELD | Source: Ambulatory Visit | Attending: Radiation Oncology | Admitting: Radiation Oncology

## 2017-09-25 VITALS — BP 129/79 | HR 76 | Temp 97.0°F | Resp 18 | Wt 219.6 lb

## 2017-09-25 DIAGNOSIS — C61 Malignant neoplasm of prostate: Secondary | ICD-10-CM | POA: Insufficient documentation

## 2017-09-25 DIAGNOSIS — Z923 Personal history of irradiation: Secondary | ICD-10-CM | POA: Diagnosis not present

## 2017-09-25 NOTE — Progress Notes (Signed)
Radiation Oncology Follow up Note  Name: Brad Patrick   Date:   09/25/2017 MRN:  [id number] DOB: [birth year]    This 62 y.o. 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 Patient initially had stage IIIa disease with positive margins. Seen today in routine follow-up he is doing well. Specifically denies diarrhea dysuria or any other GI/GU complaints. His most recent PSA was 0.1.Brad Patrick  COMPLICATIONS OF TREATMENT: none  FOLLOW UP COMPLIANCE: keeps appointments   PHYSICAL EXAM:  BP 129/79 (BP Location: Left Arm, Patient Position: Sitting, Cuff Size: Normal)   Pulse 76   Temp (!) 97 F (36.1 C) (Tympanic)   Resp 18   Wt 219 lb 9.3 oz (99.6 kg)   BMI 30.62 kg/m  Well-developed well-nourished patient in NAD. HEENT reveals PERLA, EOMI, discs not visualized.  Oral cavity is clear. No oral mucosal lesions are identified. Neck is clear without evidence of cervical or supraclavicular adenopathy. Lungs are clear to A&P. Cardiac examination is essentially unremarkable with regular rate and rhythm without murmur rub or thrill. Abdomen is benign with no organomegaly or masses noted. Motor sensory and DTR levels are equal and symmetric in the upper and lower extremities. Cranial nerves II through XII are grossly intact. Proprioception is intact. No peripheral adenopathy or edema is identified. No motor or sensory levels are noted. Crude visual fields are within normal range.  RADIOLOGY RESULTS: no current films for review  PLAN: present time patient is doing well under excellent biochemical control of his prostate cancer. I am please was overall progress. He is currently off Lupron suppression. I've  asked to see him back in 1 year for follow-up. Patient knows to call sooner with any concerns.  I would like to take this opportunity to thank you for allowing me to participate in the care of your patient.Noreene Filbert, MD

## 2017-09-25 NOTE — Progress Notes (Signed)
Here for follow up.overall stated he is " doing good"

## 2018-02-03 ENCOUNTER — Other Ambulatory Visit: Payer: Self-pay

## 2018-02-03 DIAGNOSIS — C61 Malignant neoplasm of prostate: Secondary | ICD-10-CM

## 2018-02-04 ENCOUNTER — Other Ambulatory Visit: Payer: BLUE CROSS/BLUE SHIELD

## 2018-02-04 DIAGNOSIS — C61 Malignant neoplasm of prostate: Secondary | ICD-10-CM

## 2018-02-05 ENCOUNTER — Telehealth: Payer: Self-pay

## 2018-02-05 LAB — PSA: Prostate Specific Ag, Serum: <0.1 ng/mL (ref 0.0–4.0)

## 2018-02-05 NOTE — Telephone Encounter (Signed)
-----   Message from Hollice Espy, MD sent at 02/05/2018 10:36 AM EST ----- PSA undetectable  Hollice Espy, MD

## 2018-02-05 NOTE — Telephone Encounter (Signed)
Called pt informed him of the information below. Pt gave verbal understanding.  

## 2018-02-11 ENCOUNTER — Ambulatory Visit: Payer: BLUE CROSS/BLUE SHIELD | Admitting: Urology

## 2018-02-25 ENCOUNTER — Encounter: Payer: Self-pay | Admitting: Urology

## 2018-02-25 ENCOUNTER — Ambulatory Visit (INDEPENDENT_AMBULATORY_CARE_PROVIDER_SITE_OTHER): Payer: BLUE CROSS/BLUE SHIELD | Admitting: Urology

## 2018-02-25 VITALS — BP 151/86 | HR 88 | Ht 71.0 in | Wt 219.0 lb

## 2018-02-25 DIAGNOSIS — N5231 Erectile dysfunction following radical prostatectomy: Secondary | ICD-10-CM | POA: Diagnosis not present

## 2018-02-25 DIAGNOSIS — Z8546 Personal history of malignant neoplasm of prostate: Secondary | ICD-10-CM | POA: Diagnosis not present

## 2018-02-25 DIAGNOSIS — I10 Essential (primary) hypertension: Secondary | ICD-10-CM | POA: Diagnosis not present

## 2018-02-25 MED ORDER — BISOPROLOL-HYDROCHLOROTHIAZIDE 5-6.25 MG PO TABS: 1.0000 | ORAL_TABLET | Freq: Every day | ORAL | 1 refills | Status: DC

## 2018-02-25 NOTE — Progress Notes (Signed)
02/25/2018 3:21 PM   Brad Patrick 08/16/1955 [id number]  Referring provider: [name], MD [facility] [place], [place] [address]  Chief Complaint  Patient presents with  . Prostate Cancer    1 yr follow up    HPI: 62 year old male with a personal history of prostate cancer returns today for routine follow-up.  He underwent robot-assisted laparoscopic radical prostatectomy in 05/2015 for Gleason 4+3 prostate cancer.  Surgical pathology as below with evidence of extracapsular extension, bladder neck invasion and positive margins at the bladder neck.  His postoperative PSA was detectable.  PSA 33, postop PSA 0.3.  He underwent a course of Lupron for 16 months, last 103-month injection given 06/2016.  Salvage radiation was completed in 2017.  Today, his PSA remains undetectable as of 01/2018.  He has complete urinary continence at this point.  He does wear a safety pad and only leaks with extreme physical activity or rigorous cough or sneeze.  He is not bothered by this.  He does continue to have significant erectile dysfunction.  He has no response to PDE 5 inhibitors.  He is not interested in intracavernosal injections.  His libido has returned.  This is not important part of his life.  Other hot flashes.  Since today that he is concerned that his new insurance is not going to be covered at [facility].  Still having trouble getting into his primary care physician's office for refill for his BP meds.  He is mildly hypertensive today  On meds and only has 3 days left of his prescriptions.   PMH: Past Medical History:  Diagnosis Date  . Elevated PSA   . HLD (hyperlipidemia)   . HTN (hypertension)   . Prostate cancer [facility])     Surgical History: Past Surgical History:  Procedure Laterality Date  . HERNIA REPAIR    . PELVIC LYMPH NODE DISSECTION N/A 05/17/2015   Procedure: PELVIC LYMPH NODE DISSECTION;  Surgeon: Hollice Espy, MD;  Location: [facility]  ORS;  Service: Urology;  Laterality: N/A;  . ROBOT ASSISTED LAPAROSCOPIC RADICAL PROSTATECTOMY N/A 05/17/2015   Procedure: ROBOTIC ASSISTED LAPAROSCOPIC RADICAL PROSTATECTOMY;  Surgeon: Hollice Espy, MD;  Location: [facility] ORS;  Service: Urology;  Laterality: N/A;    Home Medications:  Allergies as of 02/25/2018   No Known Allergies     Medication List       Accurate as of February 25, 2018  3:21 PM. Always use your most recent med list.        bisoprolol-hydrochlorothiazide 5-6.25 MG tablet Commonly known as:  ZIAC Take 1 tablet by mouth daily.       Allergies: No Known Allergies  Family History: Family History  Problem Relation Age of Onset  . Diabetes Mother   . Cancer Father   . Kidney disease Neg Hx   . Prostate cancer Neg Hx     Social History:  reports that he has never smoked. His smokeless tobacco use includes snuff. He reports current alcohol use of about 6.0 standard drinks of alcohol per week. He reports that he does not use drugs.  ROS: UROLOGY Frequent Urination?: No Hard to postpone urination?: No Burning/pain with urination?: No Get up at night to urinate?: No Leakage of urine?: No Urine stream starts and stops?: No Trouble starting stream?: No Do you have to strain to urinate?: No Blood in urine?: No Urinary tract infection?: No Sexually transmitted disease?: No Injury to kidneys or bladder?: No Painful intercourse?: No Weak  stream?: No Erection problems?: No Penile pain?: No  Gastrointestinal Nausea?: No Vomiting?: No Indigestion/heartburn?: No Diarrhea?: No Constipation?: No  Constitutional Fever: No Night sweats?: No Weight loss?: No Fatigue?: No  Skin Skin rash/lesions?: No Itching?: No  Eyes Blurred vision?: No Double vision?: No  Ears/Nose/Throat Sore throat?: No Sinus problems?: No  Hematologic/Lymphatic Swollen glands?: No Easy bruising?: No  Cardiovascular Leg swelling?: No Chest pain?:  No  Respiratory Cough?: No Shortness of breath?: No  Endocrine Excessive thirst?: No  Musculoskeletal Back pain?: No Joint pain?: No  Neurological Headaches?: No Dizziness?: No  Psychologic Depression?: No Anxiety?: No  Physical Exam: BP (!) 151/86   Pulse 88   Ht 5\' 11"  (1.803 m)   Wt 219 lb (99.3 kg)   BMI 30.54 kg/m   Constitutional:  Alert and oriented, No acute distress. HEENT: [place] AT, moist mucus membranes.  Trachea midline, no masses. Cardiovascular: No clubbing, cyanosis, or edema. Respiratory: Normal respiratory effort, no increased work of breathing. Skin: No rashes, bruises or suspicious lesions. Neurologic: Grossly intact, no focal deficits, moving all 4 extremities. Psychiatric: Normal mood and affect.  Laboratory Data: PSA as above   Assessment & Plan:    1. History of prostate cancer NED today, he has a undetectable Recommend continued PSA surveillance every 6 months He is scheduled to see Dr. Donella Stade next summer, we will see him 6 months thereafter with repeat PSA He is agreeable this plan - PSA; Future  2. Erectile dysfunction following radical prostatectomy Discussed alternatives, not interested in further pharmacotherapy at this time  3. Essential hypertension As a professional courtesy, 2 additional months of antihypertensives prescribed   Return in about 14 months (around 04/29/2019) for with PSA.  Hollice Espy, MD  [place] [facility] [address], [place], [place] [address] [phone]

## 2018-04-18 ENCOUNTER — Encounter: Payer: Self-pay | Admitting: Nurse Practitioner

## 2018-04-22 ENCOUNTER — Ambulatory Visit (INDEPENDENT_AMBULATORY_CARE_PROVIDER_SITE_OTHER): Payer: BLUE CROSS/BLUE SHIELD | Admitting: Nurse Practitioner

## 2018-04-22 ENCOUNTER — Other Ambulatory Visit: Payer: Self-pay

## 2018-04-22 ENCOUNTER — Encounter: Payer: Self-pay | Admitting: Nurse Practitioner

## 2018-04-22 VITALS — BP 128/80 | HR 80 | Temp 98.1°F | Ht 69.0 in | Wt 222.0 lb

## 2018-04-22 DIAGNOSIS — E6609 Other obesity due to excess calories: Secondary | ICD-10-CM | POA: Diagnosis not present

## 2018-04-22 DIAGNOSIS — C61 Malignant neoplasm of prostate: Secondary | ICD-10-CM

## 2018-04-22 DIAGNOSIS — I10 Essential (primary) hypertension: Secondary | ICD-10-CM | POA: Diagnosis not present

## 2018-04-22 DIAGNOSIS — Z6832 Body mass index (BMI) 32.0-32.9, adult: Secondary | ICD-10-CM

## 2018-04-22 DIAGNOSIS — E7800 Pure hypercholesterolemia, unspecified: Secondary | ICD-10-CM | POA: Diagnosis not present

## 2018-04-22 MED ORDER — BISOPROLOL-HYDROCHLOROTHIAZIDE 5-6.25 MG PO TABS: 1.0000 | ORAL_TABLET | Freq: Every day | ORAL | 3 refills | Status: DC

## 2018-04-22 NOTE — Progress Notes (Signed)
New Patient Office Visit  Subjective:  Patient ID: Brad Patrick, male    DOB: November 30, 1955  Age: 63 y.o. MRN: [id number]  CC:  Chief Complaint  Patient presents with  . Establish Care  . Medication Refill    bisoprolol    HPI Brad Patrick presents for new patient visit to establish care.  Introduced to Designer, jewellery role and practice setting.  All questions answered.  Was previously followed by Dr. Doy Hutching, but he is no longer in network.    HYPERTENSION / HYPERLIPIDEMIA Currently on Bisoprolol-HCTZ.  No current cholesterol medications and last lipid panel was 06/2016 with LDL 153 and TCHOL 245, TRIG 76 and HDL 77.1.  He agrees to repeating these labs at his physical. Satisfied with current treatment? yes Duration of hypertension: chronic BP monitoring frequency: not checking, have encouraged him to do so (his wife is a retired Marine scientist and could check) BP range:  BP medication side effects: no Duration of hyperlipidemia: chronic Cholesterol supplements: none Aspirin: no Recent stressors: no Recurrent headaches: no Visual changes: no Palpitations: no Dyspnea: no Chest pain: no Lower extremity edema: no Dizzy/lightheaded: no   PROSTATE CANCER: Followed by Dr. Erlene Quan and last seen 02/25/18.  Gleason 4+3 prostate CA.  He underwent a robot-assisted laparoscopic radical prostatectomy in March 2017.  Radiation completed 2017.  Then underwent Lupron course for 16 months, with his last injection 06/2016.  He is to have continued PSA checks Q6MOS.  Does have ED with poor response to PDE 5 medications and on discussion with Dr. Erlene Quan had not wanted to trial injections.    Depression screen [facility] 2/9 04/22/2018 09/06/2016 11/07/2015 06/27/2015 04/14/2015  Decreased Interest 0 0 0 0 0  Down, Depressed, Hopeless 0 0 0 0 0  PHQ - 2 Score 0 0 0 0 0  Altered sleeping 0 - - - -  Tired, decreased energy 1 - - - -  Change in appetite 0 - - - -  Feeling bad or failure about yourself  0 - -  - -  Trouble concentrating 0 - - - -  Moving slowly or fidgety/restless 0 - - - -  Suicidal thoughts 0 - - - -  PHQ-9 Score 1 - - - -  Difficult doing work/chores Not difficult at all - - - -   GAD 7 : Generalized Anxiety Score 04/22/2018  Nervous, Anxious, on Edge 0  Control/stop worrying 0  Worry too much - different things 0  Trouble relaxing 0  Restless 0  Easily annoyed or irritable 0  Afraid - awful might happen 0  Total GAD 7 Score 0  Anxiety Difficulty Not difficult at all   Functional Status Survey: Is the patient deaf or have difficulty hearing?: No Does the patient have difficulty seeing, even when wearing glasses/contacts?: No Does the patient have difficulty concentrating, remembering, or making decisions?: No Does the patient have difficulty walking or climbing stairs?: No Does the patient have difficulty dressing or bathing?: No Does the patient have difficulty doing errands alone such as visiting a doctor's office or shopping?: No  Past Medical History:  Diagnosis Date  . Elevated PSA   . HLD (hyperlipidemia)   . HTN (hypertension)   . Prostate cancer [facility])     Past Surgical History:  Procedure Laterality Date  . HERNIA REPAIR    . PELVIC LYMPH NODE DISSECTION N/A 05/17/2015   Procedure: PELVIC LYMPH NODE DISSECTION;  Surgeon: Hollice Espy, MD;  Location: [facility] ORS;  Service: Urology;  Laterality: N/A;  . ROBOT ASSISTED LAPAROSCOPIC RADICAL PROSTATECTOMY N/A 05/17/2015   Procedure: ROBOTIC ASSISTED LAPAROSCOPIC RADICAL PROSTATECTOMY;  Surgeon: Hollice Espy, MD;  Location: [facility] ORS;  Service: Urology;  Laterality: N/A;    Family History  Problem Relation Age of Onset  . Diabetes Mother   . Cancer Father   . Kidney disease Neg Hx   . Prostate cancer Neg Hx     Social History   Socioeconomic History  . Marital status: Married    Spouse name: Not on file  . Number of children: Not on file  . Years of education: Not on file  . Highest education  level: Not on file  Occupational History  . Not on file  Social Needs  . Financial resource strain: Not hard at all  . Food insecurity:    Worry: Never true    Inability: Never true  . Transportation needs:    Medical: No    Non-medical: No  Tobacco Use  . Smoking status: Never Smoker  . Smokeless tobacco: Current User    Types: Snuff  Substance and Sexual Activity  . Alcohol use: Yes    Alcohol/week: 6.0 standard drinks    Types: 6 Cans of beer per week    Comment: moderate 6 per day  . Drug use: No  . Sexual activity: Yes  Lifestyle  . Physical activity:    Days per week: 0 days    Minutes per session: 0 min  . Stress: Not at all  Relationships  . Social connections:    Talks on phone: More than three times a week    Gets together: More than three times a week    Attends religious service: More than 4 times per year    Active member of club or organization: No    Attends meetings of clubs or organizations: Never    Relationship status: Married  . Intimate partner violence:    Fear of current or ex partner: No    Emotionally abused: No    Physically abused: No    Forced sexual activity: No  Other Topics Concern  . Not on file  Social History Narrative  . Not on file    ROS Review of Systems  Constitutional: Negative for activity change, diaphoresis, fatigue and fever.  Respiratory: Negative for cough, chest tightness, shortness of breath and wheezing.   Cardiovascular: Negative for chest pain, palpitations and leg swelling.  Gastrointestinal: Negative for abdominal distention, abdominal pain, constipation, diarrhea, nausea and vomiting.  Endocrine: Negative for cold intolerance, heat intolerance, polydipsia, polyphagia and polyuria.  Musculoskeletal: Negative.   Skin: Negative.   Neurological: Negative for dizziness, syncope, weakness, light-headedness, numbness and headaches.  Psychiatric/Behavioral: Negative.     Objective:   Today's Vitals: BP 128/80  (BP Location: Left Arm, Patient Position: Sitting)   Pulse 80   Temp 98.1 F (36.7 C) (Oral)   Ht 5\' 9"  (1.753 m)   Wt 222 lb (100.7 kg)   SpO2 98%   BMI 32.78 kg/m   Physical Exam Vitals signs and nursing note reviewed.  Constitutional:      Appearance: He is well-developed.  HENT:     Head: Normocephalic and atraumatic.     Right Ear: Hearing normal. No drainage.     Left Ear: Hearing normal. No drainage.     Mouth/Throat:     Pharynx: Uvula midline.  Eyes:     General: Lids are normal.        Right  eye: No discharge.        Left eye: No discharge.     Conjunctiva/sclera: Conjunctivae normal.     Pupils: Pupils are equal, round, and reactive to light.  Neck:     Musculoskeletal: Normal range of motion and neck supple.     Thyroid: No thyromegaly.     Vascular: No carotid bruit or JVD.     Trachea: Trachea normal.  Cardiovascular:     Rate and Rhythm: Normal rate and regular rhythm.     Heart sounds: Normal heart sounds, S1 normal and S2 normal. No murmur. No gallop.   Pulmonary:     Effort: Pulmonary effort is normal.     Breath sounds: Normal breath sounds.  Abdominal:     General: Bowel sounds are normal.     Palpations: Abdomen is soft. There is no hepatomegaly or splenomegaly.  Musculoskeletal: Normal range of motion.     Right lower leg: No edema.     Left lower leg: No edema.  Skin:    General: Skin is warm and dry.     Capillary Refill: Capillary refill takes less than 2 seconds.     Findings: No rash.  Neurological:     Mental Status: He is alert and oriented to person, place, and time.     Deep Tendon Reflexes: Reflexes are normal and symmetric.  Psychiatric:        Mood and Affect: Mood normal.        Behavior: Behavior normal.        Thought Content: Thought content normal.        Judgment: Judgment normal.     Assessment & Plan:   Problem List Items Addressed This Visit      Cardiovascular and Mediastinum   Essential hypertension - Primary      Chronic, ongoing.  Initial BP elevated on exam with recheck below goal.  Have encouraged him to check BP three mornings a week at home and document these for provider.  Continue current medication regimen.  Refill provided.  Labs next visit.      Relevant Medications   bisoprolol-hydrochlorothiazide (ZIAC) 5-6.25 MG tablet     Genitourinary   Prostate cancer ([place])    Continue to collaborate with Dr. Erlene Quan.        Other   Pure hypercholesterolemia    Chronic, no current medications.  Recheck lipid panel at physical.      Relevant Medications   bisoprolol-hydrochlorothiazide (ZIAC) 5-6.25 MG tablet   Class 1 obesity due to excess calories without serious comorbidity with body mass index (BMI) of 32.0 to 32.9 in adult    Recommend focus on healthy diet choices and regular exercise regimen (30 minutes a day 5 days a week).         Outpatient Encounter Medications as of 04/22/2018  Medication Sig  . bisoprolol-hydrochlorothiazide (ZIAC) 5-6.25 MG tablet Take 1 tablet by mouth daily.  . [DISCONTINUED] bisoprolol-hydrochlorothiazide (ZIAC) 5-6.25 MG tablet Take 1 tablet by mouth daily.   Facility-Administered Encounter Medications as of 04/22/2018  Medication  . leuprolide (LUPRON) injection 30 mg    Follow-up: Return in about 2 weeks (around 05/06/2018) for Annual physical.   Venita Lick, NP

## 2018-04-22 NOTE — Assessment & Plan Note (Signed)
Recommend focus on healthy diet choices and regular exercise regimen (30 minutes a day 5 days a week).

## 2018-04-22 NOTE — Assessment & Plan Note (Signed)
Chronic, ongoing.  Initial BP elevated on exam with recheck below goal.  Have encouraged him to check BP three mornings a week at home and document these for provider.  Continue current medication regimen.  Refill provided.  Labs next visit.

## 2018-04-22 NOTE — Patient Instructions (Signed)
Cholesterol  Cholesterol is a fat. Your body needs a small amount of cholesterol. Cholesterol (plaque) may build up in your blood vessels (arteries). That makes you more likely to have a heart attack or stroke. You cannot feel your cholesterol level. Having a blood test is the only way to find out if your level is high. Keep your test results. Work with your doctor to keep your cholesterol at a good level. What do the results mean?  Total cholesterol is how much cholesterol is in your blood.  LDL is bad cholesterol. This is the type that can build up. Try to have low LDL.  HDL is good cholesterol. It cleans your blood vessels and carries LDL away. Try to have high HDL.  Triglycerides are fat that the body can store or burn for energy. What are good levels of cholesterol?  Total cholesterol below 200.  LDL below 100 is good for people who have health risks. LDL below 70 is good for people who have very high risks.  HDL above 40 is good. It is best to have HDL of 60 or higher.  Triglycerides below 150. How can I lower my cholesterol? Diet Follow your diet program as told by your doctor.  Choose fish, white meat chicken, or [place] that is roasted or baked. Try not to eat red meat, fried foods, sausage, or lunch meats.  Eat lots of fresh fruits and vegetables.  Choose whole grains, beans, pasta, potatoes, and cereals.  Choose olive oil, corn oil, or canola oil. Only use small amounts.  Try not to eat butter, mayonnaise, shortening, or palm kernel oils.  Try not to eat foods with trans fats.  Choose low-fat or nonfat dairy foods. ? Drink skim or nonfat milk. ? Eat low-fat or nonfat yogurt and cheeses. ? Try not to drink whole milk or cream. ? Try not to eat ice cream, egg yolks, or full-fat cheeses.  Healthy desserts include angel food cake, ginger snaps, animal crackers, hard candy, popsicles, and low-fat or nonfat frozen yogurt. Try not to eat pastries, cakes, pies, and  cookies.  Exercise Follow your exercise program as told by your doctor.  Be more active. Try gardening, walking, and taking the stairs.  Ask your doctor about ways that you can be more active. Medicine  Take over-the-counter and prescription medicines only as told by your doctor. This information is not intended to replace advice given to you by your health care provider. Make sure you discuss any questions you have with your health care provider. Document Released: 05/25/2008 Document Revised: 09/28/2015 Document Reviewed: 09/08/2015 Elsevier Interactive Patient Education  2019 [place] and Cholesterol Restricted Eating Plan Getting too much fat and cholesterol in your diet may cause health problems. Choosing the right foods helps keep your fat and cholesterol at normal levels. This can keep you from getting certain diseases. Your doctor may recommend an eating plan that includes:  Total fat: ______% or less of total calories a day.  Saturated fat: ______% or less of total calories a day.  Cholesterol: less than _________mg a day.  Fiber: ______g a day. What are tips for following this plan? Meal planning  At meals, divide your plate into four equal parts: ? Fill one-half of your plate with vegetables and green salads. ? Fill one-fourth of your plate with whole grains. ? Fill one-fourth of your plate with low-fat (lean) protein foods.  Eat fish that is high in omega-3 fats at least two times a week.  This includes mackerel, tuna, sardines, and salmon.  Eat foods that are high in fiber, such as whole grains, beans, apples, broccoli, carrots, peas, and barley. General tips   Work with your doctor to lose weight if you need to.  Avoid: ? Foods with added sugar. ? Fried foods. ? Foods with partially hydrogenated oils.  Limit alcohol intake to no more than 1 drink a day for nonpregnant women and 2 drinks a day for men. One drink equals 12 oz of beer, 5 oz of wine,  or 1 oz of hard liquor. Reading food labels  Check food labels for: ? Trans fats. ? Partially hydrogenated oils. ? Saturated fat (g) in each serving. ? Cholesterol (mg) in each serving. ? Fiber (g) in each serving.  Choose foods with healthy fats, such as: ? Monounsaturated fats. ? Polyunsaturated fats. ? Omega-3 fats.  Choose grain products that have whole grains. Look for the word "whole" as the first word in the ingredient list. Cooking  Cook foods using low-fat methods. These include baking, boiling, grilling, and broiling.  Eat more home-cooked foods. Eat at restaurants and buffets less often.  Avoid cooking using saturated fats, such as butter, cream, palm oil, palm kernel oil, and coconut oil. Recommended foods  Fruits  All fresh, canned (in natural juice), or frozen fruits. Vegetables  Fresh or frozen vegetables (raw, steamed, roasted, or grilled). Green salads. Grains  Whole grains, such as whole wheat or whole grain breads, crackers, cereals, and pasta. Unsweetened oatmeal, bulgur, barley, quinoa, or brown rice. Corn or whole wheat flour tortillas. Meats and other protein foods  Ground beef (85% or leaner), grass-fed beef, or beef trimmed of fat. Skinless chicken or [place]. Ground chicken or [place]. Pork trimmed of fat. All fish and seafood. Egg whites. Dried beans, peas, or lentils. Unsalted nuts or seeds. Unsalted canned beans. Nut butters without added sugar or oil. Dairy  Low-fat or nonfat dairy products, such as skim or 1% milk, 2% or reduced-fat cheeses, low-fat and fat-free ricotta or cottage cheese, or plain low-fat and nonfat yogurt. Fats and oils  Tub margarine without trans fats. Light or reduced-fat mayonnaise and salad dressings. Avocado. Olive, canola, sesame, or safflower oils. The items listed above may not be a complete list of foods and beverages you can eat. Contact a dietitian for more information. Foods to avoid Fruits  Canned fruit in  heavy syrup. Fruit in cream or butter sauce. Fried fruit. Vegetables  Vegetables cooked in cheese, cream, or butter sauce. Fried vegetables. Grains  White bread. White pasta. White rice. Cornbread. Bagels, pastries, and croissants. Crackers and snack foods that contain trans fat and hydrogenated oils. Meats and other protein foods  Fatty cuts of meat. Ribs, chicken wings, bacon, sausage, bologna, salami, chitterlings, fatback, hot dogs, bratwurst, and packaged lunch meats. Liver and organ meats. Whole eggs and egg yolks. Chicken and [place] with skin. Fried meat. Dairy  Whole or 2% milk, cream, half-and-half, and cream cheese. Whole milk cheeses. Whole-fat or sweetened yogurt. Full-fat cheeses. Nondairy creamers and whipped toppings. Processed cheese, cheese spreads, and cheese curds. Beverages  Alcohol. Sugar-sweetened drinks such as sodas, lemonade, and fruit drinks. Fats and oils  Butter, stick margarine, lard, shortening, ghee, or bacon fat. Coconut, palm kernel, and palm oils. Sweets and desserts  Corn syrup, sugars, honey, and molasses. Candy. Jam and jelly. Syrup. Sweetened cereals. Cookies, pies, cakes, donuts, muffins, and ice cream. The items listed above may not be a complete list of foods and beverages  you should avoid. Contact a dietitian for more information. Summary  Choosing the right foods helps keep your fat and cholesterol at normal levels. This can keep you from getting certain diseases.  At meals, fill one-half of your plate with vegetables and green salads.  Eat high-fiber foods, like whole grains, beans, apples, carrots, peas, and barley.  Limit added sugar, saturated fats, alcohol, and fried foods. This information is not intended to replace advice given to you by your health care provider. Make sure you discuss any questions you have with your health care provider. Document Released: 08/28/2011 Document Revised: 10/30/2017 Document Reviewed:  11/13/2016 Elsevier Interactive Patient Education  2019 [employer].

## 2018-04-22 NOTE — Assessment & Plan Note (Signed)
Chronic, no current medications.  Recheck lipid panel at physical.

## 2018-04-22 NOTE — Assessment & Plan Note (Signed)
Continue to collaborate with Dr. Erlene Quan.

## 2018-05-09 ENCOUNTER — Encounter: Payer: Self-pay | Admitting: Nurse Practitioner

## 2018-05-09 ENCOUNTER — Ambulatory Visit (INDEPENDENT_AMBULATORY_CARE_PROVIDER_SITE_OTHER): Payer: BLUE CROSS/BLUE SHIELD | Admitting: Nurse Practitioner

## 2018-05-09 ENCOUNTER — Ambulatory Visit: Payer: BLUE CROSS/BLUE SHIELD | Admitting: Nurse Practitioner

## 2018-05-09 DIAGNOSIS — J0100 Acute maxillary sinusitis, unspecified: Secondary | ICD-10-CM

## 2018-05-09 MED ORDER — AMOXICILLIN-POT CLAVULANATE 875-125 MG PO TABS: 1.0000 | ORAL_TABLET | Freq: Two times a day (BID) | ORAL | 0 refills | Status: AC

## 2018-05-09 NOTE — Progress Notes (Signed)
BP 124/71 (BP Location: Left Arm, Patient Position: Sitting, Cuff Size: Normal)   Pulse 89   Temp 98.1 F (36.7 C) (Oral)   Ht 5\' 11"  (1.803 m)   Wt 226 lb 4.8 oz (102.6 kg)   SpO2 99%   BMI 31.56 kg/m    Subjective:    Patient ID: Brad Patrick, male    DOB: Mar 10, 1956, 63 y.o.   MRN: [id number]  HPI: Brad Patrick is a 63 y.o. male  Chief Complaint  Patient presents with  . Sinusitis    Ongoing 5 days. Been taking Mucinex D.  . Nasal Congestion   UPPER RESPIRATORY TRACT INFECTION Started 5 days ago per his report.  Endorses that once or twice a year he has to "fight through a sinus infection" during seasonal changes. Worst symptom: Congestion Fever: no Cough: no Shortness of breath: no Wheezing: no Chest pain: no Chest tightness: no Chest congestion: no Nasal congestion: yes Runny nose: yes Post nasal drip: yes Sneezing: yes Sore throat: no Swollen glands: no Sinus pressure: yes Headache: yes Face pain: yes Toothache: no Ear pain: none Ear pressure: none Eyes red/itching:no Eye drainage/crusting: no  Vomiting: no Rash: no Fatigue: yes Sick contacts: yes Strep contacts: no  Context: fluctuating Recurrent sinusitis: no Relief with OTC cold/cough medications: no  Treatments attempted: mucinex and Sudafed  Relevant past medical, surgical, family and social history reviewed and updated as indicated. Interim medical history since our last visit reviewed. Allergies and medications reviewed and updated.  Review of Systems  Constitutional: Positive for fatigue. Negative for activity change, diaphoresis and fever.  HENT: Positive for congestion, postnasal drip, rhinorrhea, sinus pressure, sinus pain and sneezing. Negative for ear discharge, ear pain, sore throat, tinnitus and voice change.   Eyes: Negative for pain and visual disturbance.  Respiratory: Negative for cough, chest tightness, shortness of breath and wheezing.   Cardiovascular: Negative for  chest pain, palpitations and leg swelling.  Gastrointestinal: Negative for abdominal distention, abdominal pain, constipation, diarrhea, nausea and vomiting.  Endocrine: Negative for cold intolerance, heat intolerance, polydipsia, polyphagia and polyuria.  Musculoskeletal: Negative.   Skin: Negative.   Neurological: Negative for dizziness, syncope, weakness, light-headedness, numbness and headaches.  Psychiatric/Behavioral: Negative.     Per HPI unless specifically indicated above     Objective:    BP 124/71 (BP Location: Left Arm, Patient Position: Sitting, Cuff Size: Normal)   Pulse 89   Temp 98.1 F (36.7 C) (Oral)   Ht 5\' 11"  (1.803 m)   Wt 226 lb 4.8 oz (102.6 kg)   SpO2 99%   BMI 31.56 kg/m   Wt Readings from Last 3 Encounters:  05/09/18 226 lb 4.8 oz (102.6 kg)  04/22/18 222 lb (100.7 kg)  02/25/18 219 lb (99.3 kg)    Physical Exam Vitals signs and nursing note reviewed.  Constitutional:      General: He is awake.     Appearance: He is well-developed.  HENT:     Head: Normocephalic and atraumatic.     Right Ear: Hearing, ear canal and external ear normal. No drainage. A middle ear effusion is present.     Left Ear: Hearing, ear canal and external ear normal. No drainage. A middle ear effusion is present.     Nose: Mucosal edema and rhinorrhea present.     Right Sinus: Maxillary sinus tenderness present. No frontal sinus tenderness.     Left Sinus: Maxillary sinus tenderness present. No frontal sinus tenderness.     Mouth/Throat:  Mouth: Mucous membranes are moist.     Pharynx: Uvula midline. Posterior oropharyngeal erythema (mild with cobblestoning) present. No pharyngeal swelling or oropharyngeal exudate.     Tonsils: Swelling: 0 on the right. 0 on the left.  Eyes:     General: Lids are normal.        Right eye: No discharge.        Left eye: No discharge.     Conjunctiva/sclera: Conjunctivae normal.     Pupils: Pupils are equal, round, and reactive to  light.  Neck:     Musculoskeletal: Normal range of motion and neck supple.     Thyroid: No thyromegaly.     Vascular: No carotid bruit or JVD.     Trachea: Trachea normal.  Cardiovascular:     Rate and Rhythm: Normal rate and regular rhythm.     Heart sounds: Normal heart sounds, S1 normal and S2 normal. No murmur. No gallop.   Pulmonary:     Effort: Pulmonary effort is normal.     Breath sounds: Normal breath sounds.     Comments: Clear throughout Abdominal:     General: Bowel sounds are normal.     Palpations: Abdomen is soft. There is no hepatomegaly or splenomegaly.  Musculoskeletal: Normal range of motion.     Right lower leg: No edema.     Left lower leg: No edema.  Lymphadenopathy:     Head:     Right side of head: No submental, submandibular, tonsillar, preauricular or posterior auricular adenopathy.     Left side of head: No submental, submandibular, tonsillar, preauricular or posterior auricular adenopathy.     Cervical: No cervical adenopathy.  Skin:    General: Skin is warm and dry.     Capillary Refill: Capillary refill takes less than 2 seconds.     Findings: No rash.  Neurological:     Mental Status: He is alert and oriented to person, place, and time.     Deep Tendon Reflexes: Reflexes are normal and symmetric.  Psychiatric:        Attention and Perception: Attention normal.        Mood and Affect: Mood normal.        Behavior: Behavior normal. Behavior is cooperative.        Thought Content: Thought content normal.        Judgment: Judgment normal.     Results for orders placed or performed in visit on 02/04/18  PSA  Result Value Ref Range   Prostate Specific Ag, Serum <0.1 0.0 - 4.0 ng/mL      Assessment & Plan:   Problem List Items Addressed This Visit      Respiratory   Acute maxillary sinusitis    With symptoms present and worsening x 5 days.  Due to worsening symptoms and physical exam script sent for Augmentin.  Discussed not using Sudafed,  but using Coricidin at home for symptom management d/t HTN.  Sinus rinses and humidifier at home.  Return for worsening or continued symptoms.      Relevant Medications   amoxicillin-clavulanate (AUGMENTIN) 875-125 MG tablet       Follow up plan: Return if symptoms worsen or fail to improve.

## 2018-05-09 NOTE — Patient Instructions (Signed)

## 2018-05-09 NOTE — Assessment & Plan Note (Signed)
With symptoms present and worsening x 5 days.  Due to worsening symptoms and physical exam script sent for Augmentin.  Discussed not using Sudafed, but using Coricidin at home for symptom management d/t HTN.  Sinus rinses and humidifier at home.  Return for worsening or continued symptoms.

## 2018-05-13 ENCOUNTER — Encounter: Payer: Self-pay | Admitting: Nurse Practitioner

## 2018-05-14 ENCOUNTER — Encounter: Payer: Self-pay | Admitting: Nurse Practitioner

## 2018-05-14 ENCOUNTER — Ambulatory Visit (INDEPENDENT_AMBULATORY_CARE_PROVIDER_SITE_OTHER): Payer: BLUE CROSS/BLUE SHIELD | Admitting: Nurse Practitioner

## 2018-05-14 ENCOUNTER — Other Ambulatory Visit: Payer: Self-pay

## 2018-05-14 VITALS — BP 125/81 | HR 81 | Temp 98.1°F | Ht 71.0 in | Wt 220.0 lb

## 2018-05-14 DIAGNOSIS — Z8546 Personal history of malignant neoplasm of prostate: Secondary | ICD-10-CM | POA: Diagnosis not present

## 2018-05-14 DIAGNOSIS — Z0000 Encounter for general adult medical examination without abnormal findings: Secondary | ICD-10-CM | POA: Diagnosis not present

## 2018-05-14 DIAGNOSIS — Z23 Encounter for immunization: Secondary | ICD-10-CM | POA: Diagnosis not present

## 2018-05-14 DIAGNOSIS — Z114 Encounter for screening for human immunodeficiency virus [HIV]: Secondary | ICD-10-CM

## 2018-05-14 DIAGNOSIS — E6609 Other obesity due to excess calories: Secondary | ICD-10-CM

## 2018-05-14 DIAGNOSIS — I10 Essential (primary) hypertension: Secondary | ICD-10-CM

## 2018-05-14 DIAGNOSIS — N529 Male erectile dysfunction, unspecified: Secondary | ICD-10-CM | POA: Insufficient documentation

## 2018-05-14 DIAGNOSIS — N5235 Erectile dysfunction following radiation therapy: Secondary | ICD-10-CM

## 2018-05-14 DIAGNOSIS — Z6832 Body mass index (BMI) 32.0-32.9, adult: Secondary | ICD-10-CM

## 2018-05-14 DIAGNOSIS — E7800 Pure hypercholesterolemia, unspecified: Secondary | ICD-10-CM | POA: Diagnosis not present

## 2018-05-14 DIAGNOSIS — Z1159 Encounter for screening for other viral diseases: Secondary | ICD-10-CM

## 2018-05-14 NOTE — Assessment & Plan Note (Signed)
Chronic, ongoing.  Continue current medication regimen.  BP at goal today.  CMP today.

## 2018-05-14 NOTE — Assessment & Plan Note (Signed)
Recommend continued focus on health diet choices and regular physical activity (30 minutes 5 days a week). 

## 2018-05-14 NOTE — Assessment & Plan Note (Signed)
Followed by urology.   

## 2018-05-14 NOTE — Progress Notes (Signed)
BP 125/81   Pulse 81   Temp 98.1 F (36.7 C) (Oral)   Ht 5\' 11"  (1.803 m)   Wt 220 lb (99.8 kg)   SpO2 97%   BMI 30.68 kg/m    Subjective:    Patient ID: Brad Patrick, male    DOB: 04/11/1955, 63 y.o.   MRN: [id number]  HPI: Brad Patrick is a 63 y.o. male presenting on 05/14/2018 for comprehensive medical examination. Current medical complaints include:none  He currently lives with: wife Interim Problems from his last visit: no   HYPERTENSION / HYPERLIPIDEMIA No current cholesterol medications with last lipid panel in 2018 showing TCHOL 245 and LDL 153.  Current BP medication Ziac. Satisfied with current treatment? yes Duration of hypertension: chronic BP monitoring frequency: not checking BP range:  BP medication side effects: no Duration of hyperlipidemia: chronic Aspirin: no Recent stressors: no Recurrent headaches: no Visual changes: no Palpitations: no Dyspnea: no Chest pain: no Lower extremity edema: no Dizzy/lightheaded: no   H/O Prostate Cancer: Followed by Dr. Delmar Landau: last seen 02/25/18.  Gleason 4+3 prostate CA.  Underwent a robot-assisted laparoscopic radical prostatectomy in March 2017 with radiation completed 2017.  Then underwent Lupron course for 16 months.  He is to have continued PSA checks Q6MOS.  Does have ED with poor response to PDE 5 medications and on discussion with Dr. Erlene Quan he has not wanted to trial injections.     Functional Status Survey: Is the patient deaf or have difficulty hearing?: No Does the patient have difficulty seeing, even when wearing glasses/contacts?: No Does the patient have difficulty concentrating, remembering, or making decisions?: No Does the patient have difficulty walking or climbing stairs?: No Does the patient have difficulty dressing or bathing?: No Does the patient have difficulty doing errands alone such as visiting a doctor's office or shopping?: No  FALL RISK: Fall Risk  05/14/2018 09/25/2017 09/06/2016  11/07/2015 06/27/2015  Falls in the past year? 0 No No No No  Number falls in past yr: 0 - - - -  Injury with Fall? 0 - - - -  Follow up Falls evaluation completed - - - -    Depression Screen Depression screen [facility] 2/9 05/14/2018 04/22/2018 09/06/2016 11/07/2015 06/27/2015  Decreased Interest 0 0 0 0 0  Down, Depressed, Hopeless 0 0 0 0 0  PHQ - 2 Score 0 0 0 0 0  Altered sleeping 0 0 - - -  Tired, decreased energy 1 1 - - -  Change in appetite 0 0 - - -  Feeling bad or failure about yourself  0 0 - - -  Trouble concentrating 0 0 - - -  Moving slowly or fidgety/restless 0 0 - - -  Suicidal thoughts 0 0 - - -  PHQ-9 Score 1 1 - - -  Difficult doing work/chores Not difficult at all Not difficult at all - - -   GAD 7 : Generalized Anxiety Score 05/14/2018 04/22/2018  Nervous, Anxious, on Edge 0 0  Control/stop worrying 0 0  Worry too much - different things 0 0  Trouble relaxing 0 0  Restless 0 0  Easily annoyed or irritable 0 0  Afraid - awful might happen 0 0  Total GAD 7 Score 0 0  Anxiety Difficulty Not difficult at all Not difficult at all    Advanced Directives Not currently in place  Past Medical History:  Past Medical History:  Diagnosis Date  . Elevated PSA   . HLD (hyperlipidemia)   .  HTN (hypertension)   . Prostate cancer [facility])     Surgical History:  Past Surgical History:  Procedure Laterality Date  . HERNIA REPAIR    . PELVIC LYMPH NODE DISSECTION N/A 05/17/2015   Procedure: PELVIC LYMPH NODE DISSECTION;  Surgeon: Hollice Espy, MD;  Location: [facility] ORS;  Service: Urology;  Laterality: N/A;  . ROBOT ASSISTED LAPAROSCOPIC RADICAL PROSTATECTOMY N/A 05/17/2015   Procedure: ROBOTIC ASSISTED LAPAROSCOPIC RADICAL PROSTATECTOMY;  Surgeon: Hollice Espy, MD;  Location: [facility] ORS;  Service: Urology;  Laterality: N/A;    Medications:  Current Outpatient Medications on File Prior to Visit  Medication Sig  . amoxicillin-clavulanate (AUGMENTIN) 875-125 MG tablet Take 1 tablet  by mouth 2 (two) times daily for 7 days.  . bisoprolol-hydrochlorothiazide (ZIAC) 5-6.25 MG tablet Take 1 tablet by mouth daily.   Current Facility-Administered Medications on File Prior to Visit  Medication  . leuprolide (LUPRON) injection 30 mg    Allergies:  No Known Allergies  Social History:  Social History   Socioeconomic History  . Marital status: Married    Spouse name: Not on file  . Number of children: Not on file  . Years of education: Not on file  . Highest education level: Not on file  Occupational History  . Not on file  Social Needs  . Financial resource strain: Not hard at all  . Food insecurity:    Worry: Never true    Inability: Never true  . Transportation needs:    Medical: No    Non-medical: No  Tobacco Use  . Smoking status: Never Smoker  . Smokeless tobacco: Current User    Types: Snuff  Substance and Sexual Activity  . Alcohol use: Yes    Alcohol/week: 6.0 standard drinks    Types: 6 Cans of beer per week    Comment: moderate 6 per day  . Drug use: No  . Sexual activity: Yes  Lifestyle  . Physical activity:    Days per week: 0 days    Minutes per session: 0 min  . Stress: Not at all  Relationships  . Social connections:    Talks on phone: More than three times a week    Gets together: More than three times a week    Attends religious service: More than 4 times per year    Active member of club or organization: No    Attends meetings of clubs or organizations: Never    Relationship status: Married  . Intimate partner violence:    Fear of current or ex partner: No    Emotionally abused: No    Physically abused: No    Forced sexual activity: No  Other Topics Concern  . Not on file  Social History Narrative  . Not on file   Social History   Tobacco Use  Smoking Status Never Smoker  Smokeless Tobacco Current User  . Types: Snuff   Social History   Substance and Sexual Activity  Alcohol Use Yes  . Alcohol/week: 6.0 standard  drinks  . Types: 6 Cans of beer per week   Comment: moderate 6 per day    Family History:  Family History  Problem Relation Age of Onset  . Diabetes Mother   . Cancer Father   . Kidney disease Neg Hx   . Prostate cancer Neg Hx     Past medical history, surgical history, medications, allergies, family history and social history reviewed with patient today and changes made to appropriate areas of the chart.  Review of Systems - Negative All other ROS negative except what is listed above and in the HPI.      Objective:    BP 125/81   Pulse 81   Temp 98.1 F (36.7 C) (Oral)   Ht 5\' 11"  (1.803 m)   Wt 220 lb (99.8 kg)   SpO2 97%   BMI 30.68 kg/m   Wt Readings from Last 3 Encounters:  05/14/18 220 lb (99.8 kg)  05/09/18 226 lb 4.8 oz (102.6 kg)  04/22/18 222 lb (100.7 kg)    Physical Exam Vitals signs and nursing note reviewed.  Constitutional:      General: He is awake.     Appearance: He is well-developed and overweight.  HENT:     Head: Normocephalic and atraumatic.     Right Ear: Hearing, tympanic membrane, ear canal and external ear normal. No decreased hearing noted. No drainage.     Left Ear: Hearing, tympanic membrane, ear canal and external ear normal. No decreased hearing noted. No drainage.     Nose: Nose normal.     Right Sinus: No maxillary sinus tenderness or frontal sinus tenderness.     Left Sinus: No maxillary sinus tenderness or frontal sinus tenderness.     Mouth/Throat:     Mouth: Mucous membranes are moist.     Pharynx: Oropharynx is clear. Uvula midline.  Eyes:     General: Lids are normal.        Right eye: No discharge.        Left eye: No discharge.     Conjunctiva/sclera: Conjunctivae normal.     Pupils: Pupils are equal, round, and reactive to light.  Neck:     Musculoskeletal: Normal range of motion and neck supple.     Thyroid: No thyromegaly.     Vascular: No carotid bruit or JVD.     Trachea: Trachea normal.  Cardiovascular:      Rate and Rhythm: Normal rate and regular rhythm.     Heart sounds: Normal heart sounds, S1 normal and S2 normal. No murmur. No gallop.   Pulmonary:     Effort: Pulmonary effort is normal.     Breath sounds: Normal breath sounds.     Comments: Clear throughout Abdominal:     General: Bowel sounds are normal.     Palpations: Abdomen is soft. There is no hepatomegaly or splenomegaly.  Genitourinary:    Comments: Deferred per patient request Musculoskeletal: Normal range of motion.     Right lower leg: No edema.     Left lower leg: No edema.  Lymphadenopathy:     Head:     Right side of head: No submental, submandibular, tonsillar or preauricular adenopathy.     Left side of head: No submental, submandibular, tonsillar or preauricular adenopathy.     Cervical: No cervical adenopathy.  Skin:    General: Skin is warm and dry.     Capillary Refill: Capillary refill takes less than 2 seconds.     Findings: No rash.  Neurological:     Mental Status: He is alert and oriented to person, place, and time.     Deep Tendon Reflexes: Reflexes are normal and symmetric.     Reflex Scores:      Brachioradialis reflexes are 2+ on the right side and 2+ on the left side.      Patellar reflexes are 2+ on the right side and 2+ on the left side. Psychiatric:  Attention and Perception: Attention normal.        Mood and Affect: Mood normal.        Speech: Speech normal.        Behavior: Behavior normal. Behavior is cooperative.        Thought Content: Thought content normal.        Cognition and Memory: Cognition normal.        Judgment: Judgment normal.     Results for orders placed or performed in visit on 02/04/18  PSA  Result Value Ref Range   Prostate Specific Ag, Serum <0.1 0.0 - 4.0 ng/mL      Assessment & Plan:   Problem List Items Addressed This Visit      Cardiovascular and Mediastinum   Essential hypertension    Chronic, ongoing.  Continue current medication regimen.  BP at  goal today.  CMP today.      Relevant Orders   CBC with Differential/Platelet   Comprehensive metabolic panel     Other   Pure hypercholesterolemia    Chronic with no current medications.  Lipid panel today.  He is unsure of whether he would want to start diet, may like to follow change in diet for 6 months and recheck.  Lengthy conversation had with patient about risks with elevated cholesterol.      Relevant Orders   Lipid Panel w/o Chol/HDL Ratio   H/O prostate cancer    Followed by urology.      Class 1 obesity due to excess calories without serious comorbidity with body mass index (BMI) of 32.0 to 32.9 in adult    Recommend continued focus on health diet choices and regular physical activity (30 minutes 5 days a week).      Erectile dysfunction    Poor response to PDE 5 on review of urology notes and pt not wishing to trial injections.  Continue to monitor.       Other Visit Diagnoses    Encounter for annual physical exam    -  Primary   Relevant Orders   TSH   Encounter for screening for HIV       Relevant Orders   HIV Antibody (routine testing w rflx)   Need for hepatitis C screening test       Relevant Orders   Hepatitis C antibody   Need for Tdap vaccination       Relevant Orders   Tdap vaccine greater than or equal to 7yo IM (Completed)       Discussed aspirin prophylaxis for myocardial infarction prevention and decision was that we recommended ASA, and patient refused  LABORATORY TESTING:  Health maintenance labs ordered today as discussed above.   The natural history of prostate cancer and ongoing controversy regarding screening and potential treatment outcomes of prostate cancer has been discussed with the patient. The meaning of a false positive PSA and a false negative PSA has been discussed. He indicates understanding of the limitations of this screening test and wishes to proceed with screening PSA testing (has screening with urology  Q6MOS).   IMMUNIZATIONS:   - Tdap: Tetanus vaccination status reviewed: ordered today - Influenza: Refused - Pneumovax: Not applicable - Prevnar: Not applicable - Zostavax vaccine: Refused  SCREENING: - Colonoscopy: he wishes to talk to wife and will let me know  Discussed with patient purpose of the colonoscopy is to detect colon cancer at curable precancerous or early stages   - AAA Screening: Refused  -Hearing Test: none -  Spirometry: Refused   PATIENT COUNSELING:    Sexuality: Discussed sexually transmitted diseases, partner selection, use of condoms, avoidance of unintended pregnancy  and contraceptive alternatives.   Advised to avoid cigarette smoking.  I discussed with the patient that most people either abstain from alcohol or drink within safe limits (<=14/week and <=4 drinks/occasion for males, <=7/weeks and <= 3 drinks/occasion for females) and that the risk for alcohol disorders and other health effects rises proportionally with the number of drinks per week and how often a drinker exceeds daily limits.  Discussed cessation/primary prevention of drug use and availability of treatment for abuse.   Diet: Encouraged to adjust caloric intake to maintain  or achieve ideal body weight, to reduce intake of dietary saturated fat and total fat, to limit sodium intake by avoiding high sodium foods and not adding table salt, and to maintain adequate dietary potassium and calcium preferably from fresh fruits, vegetables, and low-fat dairy products.    stressed the importance of regular exercise  Injury prevention: Discussed safety belts, safety helmets, smoke detector, smoking near bedding or upholstery.   Dental health: Discussed importance of regular tooth brushing, flossing, and dental visits.   Follow up plan: NEXT PREVENTATIVE PHYSICAL DUE IN 1 YEAR. Return in about 6 months (around 11/14/2018) for HTN/HLD.

## 2018-05-14 NOTE — Assessment & Plan Note (Signed)
Poor response to PDE 5 on review of urology notes and pt not wishing to trial injections.  Continue to monitor.

## 2018-05-14 NOTE — Assessment & Plan Note (Signed)
Chronic with no current medications.  Lipid panel today.  He is unsure of whether he would want to start diet, may like to follow change in diet for 6 months and recheck.  Lengthy conversation had with patient about risks with elevated cholesterol.

## 2018-05-14 NOTE — Patient Instructions (Signed)
Your LDL is above normal.  The LDL is the bad cholesterol.  Over time and in combination with inflammation and other factors, this contributes to plaque which in turn may lead to stroke and/or heart attack down the road.  Sometimes high LDL is primarily genetic, and people might be eating all the right foods but still have high numbers.  Other times, there is room for improvement in one's diet and eating healthier can bring this number down and potentially reduce one's risk of heart attack and/or stroke. To reduce your LDL, Remember - more fruits and vegetables, more fish, and limitnred meat and dairy products.  More soy, nuts, beans, barley, lentils, oats and plant sterol ester enriched margarine instead of butter.  I also encourage eliminating sugar and processed food.  Remember, shop on the outside of the grocery store and visit your [employer].   If you would like to talk with me about dietary changes plus or minus medications for your cholesterol, please let me know.   Rosuvastatin Tablets What is this medicine? ROSUVASTATIN (roe SOO va sta tin) is known as a HMG-CoA reductase inhibitor or 'statin'. It lowers cholesterol and triglycerides in the blood. This drug may also reduce the risk of heart attack, stroke, or other health problems in patients with risk factors for heart disease. Diet and lifestyle changes are often used with this drug. This medicine may be used for other purposes; ask your health care provider or pharmacist if you have questions. COMMON BRAND NAME(S): Crestor What should I tell my health care provider before I take this medicine? They need to know if you have any of these conditions: -diabetes -if you often drink alcohol -history of stroke -kidney disease -liver disease -muscle aches or weakness -thyroid disease -an unusual or allergic reaction to rosuvastatin, other medicines, foods, dyes, or preservatives -pregnant or trying to get pregnant -breast-feeding How  should I use this medicine? Take this medicine by mouth with a glass of water. Follow the directions on the prescription label. Do not cut, crush or chew this medicine. You can take this medicine with or without food. Take your doses at regular intervals. Do not take your medicine more often than directed. Talk to your pediatrician regarding the use of this medicine in children. While this drug may be prescribed for children as young as 45 years old for selected conditions, precautions do apply. Overdosage: If you think you have taken too much of this medicine contact a poison control center or emergency room at once. NOTE: This medicine is only for you. Do not share this medicine with others. What if I miss a dose? If you miss a dose, take it as soon as you can. If your next dose is to be taken in less than 12 hours, then do not take the missed dose. Take the next dose at your regular time. Do not take double or extra doses. What may interact with this medicine? Do not take this medicine with any of the following medications: -herbal medicines like red yeast rice This medicine may also interact with the following medications: -alcohol -antacids containing aluminum hydroxide or magnesium hydroxide -cyclosporine -other medicines for high cholesterol -some medicines for HIV infection -warfarin This list may not describe all possible interactions. Give your health care provider a list of all the medicines, herbs, non-prescription drugs, or dietary supplements you use. Also tell them if you smoke, drink alcohol, or use illegal drugs. Some items may interact with your medicine. What should  I watch for while using this medicine? Visit your doctor or health care professional for regular check-ups. You may need regular tests to make sure your liver is working properly. Your health care professional may tell you to stop taking this medicine if you develop muscle problems. If your muscle problems do not go  away after stopping this medicine, contact your health care professional. Do not become pregnant while taking this medicine. Women should inform their health care professional if they wish to become pregnant or think they might be pregnant. There is a potential for serious side effects to an unborn child. Talk to your health care professional or pharmacist for more information. Do not breast-feed an infant while taking this medicine. This medicine may affect blood sugar levels. If you have diabetes, check with your doctor or health care professional before you change your diet or the dose of your diabetic medicine. If you are going to need surgery or other procedure, tell your doctor that you are using this medicine. This drug is only part of a total heart-health program. Your doctor or a dietician can suggest a low-cholesterol and low-fat diet to help. Avoid alcohol and smoking, and keep a proper exercise schedule. This medicine may cause a decrease in Co-Enzyme Q-10. You should make sure that you get enough Co-Enzyme Q-10 while you are taking this medicine. Discuss the foods you eat and the vitamins you take with your health care professional. What side effects may I notice from receiving this medicine? Side effects that you should report to your doctor or health care professional as soon as possible: -allergic reactions like skin rash, itching or hives, swelling of the face, lips, or tongue -dark urine -fever -joint pain -muscle cramps, pain -redness, blistering, peeling or loosening of the skin, including inside the mouth -trouble passing urine or change in the amount of urine -unusually weak or tired -yellowing of the eyes or skin Side effects that usually do not require medical attention (report to your doctor or health care professional if they continue or are bothersome): -constipation -heartburn -nausea -stomach gas, pain, upset This list may not describe all possible side effects.  Call your doctor for medical advice about side effects. You may report side effects to FDA at 1-800-FDA-1088. Where should I keep my medicine? Keep out of the reach of children. Store at room temperature between 20 and 25 degrees C (68 and 77 degrees F). Keep container tightly closed (protect from moisture). Throw away any unused medicine after the expiration date. NOTE: This sheet is a summary. It may not cover all possible information. If you have questions about this medicine, talk to your doctor, pharmacist, or health care provider.  2019 Elsevier/Gold Standard (2016-10-30 12:42:43)  Fat and Cholesterol Restricted Eating Plan Getting too much fat and cholesterol in your diet may cause health problems. Choosing the right foods helps keep your fat and cholesterol at normal levels. This can keep you from getting certain diseases. Your doctor may recommend an eating plan that includes:  Total fat: ______% or less of total calories a day.  Saturated fat: ______% or less of total calories a day.  Cholesterol: less than _________mg a day.  Fiber: ______g a day. What are tips for following this plan? Meal planning  At meals, divide your plate into four equal parts: ? Fill one-half of your plate with vegetables and green salads. ? Fill one-fourth of your plate with whole grains. ? Fill one-fourth of your plate with low-fat (lean) protein  foods.  Eat fish that is high in omega-3 fats at least two times a week. This includes mackerel, tuna, sardines, and salmon.  Eat foods that are high in fiber, such as whole grains, beans, apples, broccoli, carrots, peas, and barley. General tips   Work with your doctor to lose weight if you need to.  Avoid: ? Foods with added sugar. ? Fried foods. ? Foods with partially hydrogenated oils.  Limit alcohol intake to no more than 1 drink a day for nonpregnant women and 2 drinks a day for men. One drink equals 12 oz of beer, 5 oz of wine, or 1 oz of  hard liquor. Reading food labels  Check food labels for: ? Trans fats. ? Partially hydrogenated oils. ? Saturated fat (g) in each serving. ? Cholesterol (mg) in each serving. ? Fiber (g) in each serving.  Choose foods with healthy fats, such as: ? Monounsaturated fats. ? Polyunsaturated fats. ? Omega-3 fats.  Choose grain products that have whole grains. Look for the word "whole" as the first word in the ingredient list. Cooking  Cook foods using low-fat methods. These include baking, boiling, grilling, and broiling.  Eat more home-cooked foods. Eat at restaurants and buffets less often.  Avoid cooking using saturated fats, such as butter, cream, palm oil, palm kernel oil, and coconut oil. Recommended foods  Fruits  All fresh, canned (in natural juice), or frozen fruits. Vegetables  Fresh or frozen vegetables (raw, steamed, roasted, or grilled). Green salads. Grains  Whole grains, such as whole wheat or whole grain breads, crackers, cereals, and pasta. Unsweetened oatmeal, bulgur, barley, quinoa, or brown rice. Corn or whole wheat flour tortillas. Meats and other protein foods  Ground beef (85% or leaner), grass-fed beef, or beef trimmed of fat. Skinless chicken or [place]. Ground chicken or [place]. Pork trimmed of fat. All fish and seafood. Egg whites. Dried beans, peas, or lentils. Unsalted nuts or seeds. Unsalted canned beans. Nut butters without added sugar or oil. Dairy  Low-fat or nonfat dairy products, such as skim or 1% milk, 2% or reduced-fat cheeses, low-fat and fat-free ricotta or cottage cheese, or plain low-fat and nonfat yogurt. Fats and oils  Tub margarine without trans fats. Light or reduced-fat mayonnaise and salad dressings. Avocado. Olive, canola, sesame, or safflower oils. The items listed above may not be a complete list of foods and beverages you can eat. Contact a dietitian for more information. Foods to avoid Fruits  Canned fruit in heavy syrup.  Fruit in cream or butter sauce. Fried fruit. Vegetables  Vegetables cooked in cheese, cream, or butter sauce. Fried vegetables. Grains  White bread. White pasta. White rice. Cornbread. Bagels, pastries, and croissants. Crackers and snack foods that contain trans fat and hydrogenated oils. Meats and other protein foods  Fatty cuts of meat. Ribs, chicken wings, bacon, sausage, bologna, salami, chitterlings, fatback, hot dogs, bratwurst, and packaged lunch meats. Liver and organ meats. Whole eggs and egg yolks. Chicken and [place] with skin. Fried meat. Dairy  Whole or 2% milk, cream, half-and-half, and cream cheese. Whole milk cheeses. Whole-fat or sweetened yogurt. Full-fat cheeses. Nondairy creamers and whipped toppings. Processed cheese, cheese spreads, and cheese curds. Beverages  Alcohol. Sugar-sweetened drinks such as sodas, lemonade, and fruit drinks. Fats and oils  Butter, stick margarine, lard, shortening, ghee, or bacon fat. Coconut, palm kernel, and palm oils. Sweets and desserts  Corn syrup, sugars, honey, and molasses. Candy. Jam and jelly. Syrup. Sweetened cereals. Cookies, pies, cakes, donuts, muffins, and  ice cream. The items listed above may not be a complete list of foods and beverages you should avoid. Contact a dietitian for more information. Summary  Choosing the right foods helps keep your fat and cholesterol at normal levels. This can keep you from getting certain diseases.  At meals, fill one-half of your plate with vegetables and green salads.  Eat high-fiber foods, like whole grains, beans, apples, carrots, peas, and barley.  Limit added sugar, saturated fats, alcohol, and fried foods. This information is not intended to replace advice given to you by your health care provider. Make sure you discuss any questions you have with your health care provider. Document Released: 08/28/2011 Document Revised: 10/30/2017 Document Reviewed: 11/13/2016 Elsevier  Interactive Patient Education  2019 [employer].

## 2018-05-15 ENCOUNTER — Telehealth: Payer: Self-pay | Admitting: *Deleted

## 2018-05-15 LAB — COMPREHENSIVE METABOLIC PANEL
ALBUMIN: 4.2 g/dL (ref 3.8–4.8)
ALT: 26 IU/L (ref 0–44)
AST: 19 IU/L (ref 0–40)
Albumin/Globulin Ratio: 1.7 (ref 1.2–2.2)
Alkaline Phosphatase: 63 IU/L (ref 39–117)
BUN / CREAT RATIO: 13 (ref 10–24)
BUN: 12 mg/dL (ref 8–27)
Bilirubin Total: 0.4 mg/dL (ref 0.0–1.2)
CO2: 22 mmol/L (ref 20–29)
Calcium: 9 mg/dL (ref 8.6–10.2)
Chloride: 102 mmol/L (ref 96–106)
Creatinine, Ser: 0.9 mg/dL (ref 0.76–1.27)
GFR calc Af Amer: 105 mL/min/{1.73_m2} (ref >59)
GFR calc non Af Amer: 91 mL/min/{1.73_m2} (ref >59)
Globulin, Total: 2.5 g/dL (ref 1.5–4.5)
Glucose: 102 mg/dL — ABNORMAL HIGH (ref 65–99)
Potassium: 4.5 mmol/L (ref 3.5–5.2)
Sodium: 139 mmol/L (ref 134–144)
Total Protein: 6.7 g/dL (ref 6.0–8.5)

## 2018-05-15 LAB — CBC WITH DIFFERENTIAL/PLATELET
Basophils Absolute: 0 10*3/uL (ref 0.0–0.2)
Basos: 1 %
EOS (ABSOLUTE): 0.3 10*3/uL (ref 0.0–0.4)
Eos: 6 %
Hematocrit: 43.1 % (ref 37.5–51.0)
Hemoglobin: 15.1 g/dL (ref 13.0–17.7)
Immature Grans (Abs): 0 10*3/uL (ref 0.0–0.1)
Immature Granulocytes: 1 %
Lymphocytes Absolute: 0.6 10*3/uL — ABNORMAL LOW (ref 0.7–3.1)
Lymphs: 14 %
MCH: 29.6 pg (ref 26.6–33.0)
MCHC: 35 g/dL (ref 31.5–35.7)
MCV: 85 fL (ref 79–97)
Monocytes Absolute: 0.4 10*3/uL (ref 0.1–0.9)
Monocytes: 10 %
Neutrophils Absolute: 2.7 10*3/uL (ref 1.4–7.0)
Neutrophils: 68 %
Platelets: 247 10*3/uL (ref 150–450)
RBC: 5.1 x10E6/uL (ref 4.14–5.80)
RDW: 12.9 % (ref 11.6–15.4)
WBC: 4 10*3/uL (ref 3.4–10.8)

## 2018-05-15 LAB — HEPATITIS C ANTIBODY

## 2018-05-15 LAB — HIV ANTIBODY (ROUTINE TESTING W REFLEX): HIV Screen 4th Generation wRfx: NONREACTIVE

## 2018-05-15 LAB — LIPID PANEL W/O CHOL/HDL RATIO
Cholesterol, Total: 207 mg/dL — ABNORMAL HIGH (ref 100–199)
HDL: 61 mg/dL (ref >39)
LDL Calculated: 128 mg/dL — ABNORMAL HIGH (ref 0–99)
Triglycerides: 92 mg/dL (ref 0–149)
VLDL CHOLESTEROL CAL: 18 mg/dL (ref 5–40)

## 2018-05-15 LAB — TSH: TSH: 3.68 u[IU]/mL (ref 0.450–4.500)

## 2018-05-15 NOTE — Telephone Encounter (Signed)
Patient called about his lab results- and since they were released to his MyChart portal- reviewed PCP recommendations and follow up instructions. He will call back with ant concerns and he will follow up in 6 months.

## 2018-06-19 ENCOUNTER — Ambulatory Visit (INDEPENDENT_AMBULATORY_CARE_PROVIDER_SITE_OTHER): Payer: BLUE CROSS/BLUE SHIELD | Admitting: Nurse Practitioner

## 2018-06-19 ENCOUNTER — Other Ambulatory Visit: Payer: Self-pay

## 2018-06-19 ENCOUNTER — Encounter: Payer: Self-pay | Admitting: Nurse Practitioner

## 2018-06-19 DIAGNOSIS — W540XXD Bitten by dog, subsequent encounter: Secondary | ICD-10-CM

## 2018-06-19 DIAGNOSIS — W540XXA Bitten by dog, initial encounter: Secondary | ICD-10-CM | POA: Diagnosis not present

## 2018-06-19 DIAGNOSIS — S81851A Open bite, right lower leg, initial encounter: Secondary | ICD-10-CM | POA: Diagnosis not present

## 2018-06-19 DIAGNOSIS — S81851D Open bite, right lower leg, subsequent encounter: Secondary | ICD-10-CM | POA: Insufficient documentation

## 2018-06-19 MED ORDER — MUPIROCIN 2 % EX OINT: 1.0000 "application " | TOPICAL_OINTMENT | Freq: Two times a day (BID) | CUTANEOUS | 0 refills | Status: DC

## 2018-06-19 MED ORDER — AMOXICILLIN-POT CLAVULANATE 875-125 MG PO TABS: 1.0000 | ORAL_TABLET | Freq: Two times a day (BID) | ORAL | 0 refills | Status: AC

## 2018-06-19 NOTE — Assessment & Plan Note (Signed)
Acute with clean, well-approximated wound bed.  Up to date on tetanus (received last month).  Script for Augmentin and Mupirocin sent to pharmacy.  Recommend checking temperature daily and to notify provider if elevations > 100.  Monitor area for s/s infection and if present notify provider immediately or go to closest UC or ER. Return in one week for follow-up.

## 2018-06-19 NOTE — Progress Notes (Signed)
Ht 5\' 11"  (1.803 m)    BMI 30.68 kg/m    Subjective:    Patient ID: Brad Patrick, male    DOB: Jul 18, 1955, 63 y.o.   MRN: [id number]  HPI: Brad Patrick is a 63 y.o. male  Chief Complaint  Patient presents with   Animal Bite    dog bite yesterday. right lower leg     This visit was completed via WebEx due to the restrictions of the COVID-19 pandemic. All issues as above were discussed and addressed. Physical exam was done as above through visual confirmation on WebEx. If it was felt that the patient should be evaluated in the office, they were directed there. The patient verbally consented to this visit.  Location of the patient: work  Location of the provider: home  Those involved with this call:   Provider: Marnee Guarneri, DNP, AGPCNP-C  CMA: Lesle Chris, [facility]k/Registration: Don Perking   Time spent on call: 15 minutes with patient face to face via video conference. More than 50% of this time was spent in counseling and coordination of care.   DOG BITE TO RIGHT LOWER LEG: Last Tdap 05/14/2018.  He reports he was going to see a customer around 4:30 yesterday, works for [employer].  Customer was walking with her dog and the dog went around behind him, turned around and bit him on the right lower leg (calf area).  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  He had cleaning supplies in his truck and his wife is a Marine scientist, who then cleaned wound with hydrogen peroxide and neosporin when he came home.  Washed for 15 minutes in shower to cleanse area when he returned home.  He reports the area is clean.  Denies fever, tenderness, warmth, edema, or drainage to area.  States he has full movement of extremity without pain.  Has h/o prostate CA, but has not undergone treatment in 2 1/2 years.  Does report he has a thermometer at home and is able to check temperature daily.  Denies SOB, CP, chills.  Relevant past medical, surgical, family and social history reviewed and  updated as indicated. Interim medical history since our last visit reviewed. Allergies and medications reviewed and updated.  Review of Systems  Constitutional: Negative for activity change, diaphoresis, fatigue and fever.  Respiratory: Negative for cough, chest tightness, shortness of breath and wheezing.   Cardiovascular: Negative for chest pain, palpitations and leg swelling.  Gastrointestinal: Negative for abdominal distention, abdominal pain, constipation, diarrhea, nausea and vomiting.  Musculoskeletal: Negative.   Skin: Positive for wound.  Neurological: Negative for dizziness, syncope, weakness, light-headedness, numbness and headaches.  Psychiatric/Behavioral: Negative.     Per HPI unless specifically indicated above     Objective:    Ht 5\' 11"  (1.803 m)    BMI 30.68 kg/m   Wt Readings from Last 3 Encounters:  05/14/18 220 lb (99.8 kg)  05/09/18 226 lb 4.8 oz (102.6 kg)  04/22/18 222 lb (100.7 kg)    Physical Exam Vitals signs and nursing note reviewed.  Constitutional:      General: He is awake.     Appearance: He is well-developed. He is not ill-appearing.  HENT:     Head: Normocephalic and atraumatic.     Right Ear: Hearing normal.     Left Ear: Hearing normal.  Eyes:     General: Lids are normal.  Neck:     Musculoskeletal: Normal range of motion.  Cardiovascular:     Comments:  Unable to auscultate due to virtual visit only Pulmonary:     Effort: Pulmonary effort is normal. No accessory muscle usage or respiratory distress.     Comments: Unable to auscultate due to virtual visit only. Abdominal:     Palpations: Abdomen is soft.     Comments: Reports soft abdomen on self palpation.    Musculoskeletal: Normal range of motion.     Right lower leg: He exhibits laceration (dog bite). He exhibits no tenderness, no bony tenderness and no swelling. No edema.     Left lower leg: He exhibits no tenderness, no bony tenderness, no swelling and no laceration. No  edema.     Comments: Visual exam via virtual: Dog bite present to right lower leg on lateral aspect of lower calf.  X 1  approx 1 cm area of crusting to anterior and x 1 approx 2 cm area of crusting to posterior aspect.  No drainage visualized.  Minimal erythema present to exterior of wound.  Wound appears clean on visual.  Patient reports no warmth or edema on palpation of area.  He reports no edema to BLE.  Full passive ROM BLE.  Neurological:     Mental Status: He is alert and oriented to person, place, and time.  Psychiatric:        Mood and Affect: Mood normal.        Behavior: Behavior normal. Behavior is cooperative.        Thought Content: Thought content normal.        Judgment: Judgment normal.     Results for orders placed or performed in visit on 05/14/18  Hepatitis C antibody  Result Value Ref Range   Hep C Virus Ab <0.1 0.0 - 0.9 s/co ratio  HIV Antibody (routine testing w rflx)  Result Value Ref Range   HIV Screen 4th Generation wRfx Non Reactive Non Reactive  CBC with Differential/Platelet  Result Value Ref Range   WBC 4.0 3.4 - 10.8 x10E3/uL   RBC 5.10 4.14 - 5.80 x10E6/uL   Hemoglobin 15.1 13.0 - 17.7 g/dL   Hematocrit 43.1 37.5 - 51.0 %   MCV 85 79 - 97 fL   MCH 29.6 26.6 - 33.0 pg   MCHC 35.0 31.5 - 35.7 g/dL   RDW 12.9 11.6 - 15.4 %   Platelets 247 150 - 450 x10E3/uL   Neutrophils 68 Not Estab. %   Lymphs 14 Not Estab. %   Monocytes 10 Not Estab. %   Eos 6 Not Estab. %   Basos 1 Not Estab. %   Neutrophils Absolute 2.7 1.4 - 7.0 x10E3/uL   Lymphocytes Absolute 0.6 (L) 0.7 - 3.1 x10E3/uL   Monocytes Absolute 0.4 0.1 - 0.9 x10E3/uL   EOS (ABSOLUTE) 0.3 0.0 - 0.4 x10E3/uL   Basophils Absolute 0.0 0.0 - 0.2 x10E3/uL   Immature Granulocytes 1 Not Estab. %   Immature Grans (Abs) 0.0 0.0 - 0.1 x10E3/uL  Comprehensive metabolic panel  Result Value Ref Range   Glucose 102 (H) 65 - 99 mg/dL   BUN 12 8 - 27 mg/dL   Creatinine, Ser 0.90 0.76 - 1.27 mg/dL   GFR  calc non Af Amer 91 >59 mL/min/1.73   GFR calc Af Amer 105 >59 mL/min/1.73   BUN/Creatinine Ratio 13 10 - 24   Sodium 139 134 - 144 mmol/L   Potassium 4.5 3.5 - 5.2 mmol/L   Chloride 102 96 - 106 mmol/L   CO2 22 20 - 29 mmol/L  Calcium 9.0 8.6 - 10.2 mg/dL   Total Protein 6.7 6.0 - 8.5 g/dL   Albumin 4.2 3.8 - 4.8 g/dL   Globulin, Total 2.5 1.5 - 4.5 g/dL   Albumin/Globulin Ratio 1.7 1.2 - 2.2   Bilirubin Total 0.4 0.0 - 1.2 mg/dL   Alkaline Phosphatase 63 39 - 117 IU/L   AST 19 0 - 40 IU/L   ALT 26 0 - 44 IU/L  Lipid Panel w/o Chol/HDL Ratio  Result Value Ref Range   Cholesterol, Total 207 (H) 100 - 199 mg/dL   Triglycerides 92 0 - 149 mg/dL   HDL 61 >39 mg/dL   VLDL Cholesterol Cal 18 5 - 40 mg/dL   LDL Calculated 128 (H) 0 - 99 mg/dL  TSH  Result Value Ref Range   TSH 3.680 0.450 - 4.500 uIU/mL      Assessment & Plan:   Problem List Items Addressed This Visit      Other   Dog bite of lower leg, right, initial encounter    Acute with clean, well-approximated wound bed.  Up to date on tetanus (received last month).  Script for Augmentin and Mupirocin sent to pharmacy.  Recommend checking temperature daily and to notify provider if elevations > 100.  Monitor area for s/s infection and if present notify provider immediately or go to closest UC or ER. Return in one week for follow-up.           I discussed the assessment and treatment plan with the patient. The patient was provided an opportunity to ask questions and all were answered. The patient agreed with the plan and demonstrated an understanding of the instructions.   The patient was advised to call back or seek an in-person evaluation if the symptoms worsen or if the condition fails to improve as anticipated.   I provided 15 minutes of time during this encounter.  Follow up plan: Return in about 1 week (around 06/26/2018) for Follow-up dog bite.

## 2018-06-19 NOTE — Patient Instructions (Signed)
Animal Bite, Adult Animal bite wounds can be mild or serious. It is important to get medical treatment to prevent infection. Ask your doctor if you need treatment to prevent an infection that can spread from animals to humans (rabies). Follow these instructions at home: Wound care   Follow instructions from your doctor about how to take care of your wound. Make sure you: ? Wash your hands with soap and water before you change your bandage (dressing). If you cannot use soap and water, use hand sanitizer. ? Change your bandage as told by your doctor. ? Leave stitches (sutures), skin glue, or skin tape (adhesive) strips in place. They may need to stay in place for 2 weeks or longer. If tape strips get loose and curl up, you may trim the loose edges. Do not remove tape strips completely unless your doctor says it is okay.  Check your wound every day for signs of infection. Check for: ? More redness, swelling, or pain. ? More fluid or blood. ? Warmth. ? Pus or a bad smell. Medicines  Take or apply over-the-counter and prescription medicines only as told by your doctor.  If you were prescribed an antibiotic, take or apply it as told by your doctor. Do not stop using the antibiotic even if your wound gets better. General instructions   Keep the injured area raised (elevated) above the level of your heart while you are sitting or lying down.  If directed, put ice on the injured area. ? Put ice in a plastic bag. ? Place a towel between your skin and the bag. ? Leave the ice on for 20 minutes, 2-3 times per day.  Keep all follow-up visits as told by your doctor. This is important. Contact a doctor if:  You have more redness, swelling, or pain around your wound.  Your wound feels warm to the touch.  You have a fever or chills.  You have a general feeling of sickness (malaise).  You feel sick to your stomach (nauseous).  You throw up (vomit).  You have pain that does not get  better. Get help right away if:  You have a red streak going away from your wound.  You have any of these coming from your wound: ? Non-clear fluid. ? More blood. ? Pus or a bad smell.  You have trouble moving your injured area.  You lose feeling (have numbness) or feel tingling anywhere on your body. Summary  It is important to get the right medical treatment for animal bites. Treatment can help you to not get an infection. Ask your doctor if you need treatment to prevent an infection that can spread from animals to humans (rabies).  Check your wound every day for signs of infection, such as more redness or swelling instead of less.  If you have a red streak going away from your wound, get medical help right away. This information is not intended to replace advice given to you by your health care provider. Make sure you discuss any questions you have with your health care provider. Document Released: 02/26/2005 Document Revised: 09/06/2016 Document Reviewed: 09/06/2016 Elsevier Interactive Patient Education  2019 [employer]  

## 2018-06-26 ENCOUNTER — Ambulatory Visit (INDEPENDENT_AMBULATORY_CARE_PROVIDER_SITE_OTHER): Payer: BLUE CROSS/BLUE SHIELD | Admitting: Nurse Practitioner

## 2018-06-26 ENCOUNTER — Encounter: Payer: Self-pay | Admitting: Nurse Practitioner

## 2018-06-26 ENCOUNTER — Other Ambulatory Visit: Payer: Self-pay

## 2018-06-26 DIAGNOSIS — S81851D Open bite, right lower leg, subsequent encounter: Secondary | ICD-10-CM | POA: Diagnosis not present

## 2018-06-26 DIAGNOSIS — W540XXD Bitten by dog, subsequent encounter: Secondary | ICD-10-CM

## 2018-06-26 NOTE — Patient Instructions (Signed)
Animal Bite, Adult Animal bite wounds can be mild or serious. It is important to get medical treatment to prevent infection. Ask your doctor if you need treatment to prevent an infection that can spread from animals to humans (rabies). Follow these instructions at home: Wound care   Follow instructions from your doctor about how to take care of your wound. Make sure you: ? Wash your hands with soap and water before you change your bandage (dressing). If you cannot use soap and water, use hand sanitizer. ? Change your bandage as told by your doctor. ? Leave stitches (sutures), skin glue, or skin tape (adhesive) strips in place. They may need to stay in place for 2 weeks or longer. If tape strips get loose and curl up, you may trim the loose edges. Do not remove tape strips completely unless your doctor says it is okay.  Check your wound every day for signs of infection. Check for: ? More redness, swelling, or pain. ? More fluid or blood. ? Warmth. ? Pus or a bad smell. Medicines  Take or apply over-the-counter and prescription medicines only as told by your doctor.  If you were prescribed an antibiotic, take or apply it as told by your doctor. Do not stop using the antibiotic even if your wound gets better. General instructions   Keep the injured area raised (elevated) above the level of your heart while you are sitting or lying down.  If directed, put ice on the injured area. ? Put ice in a plastic bag. ? Place a towel between your skin and the bag. ? Leave the ice on for 20 minutes, 2-3 times per day.  Keep all follow-up visits as told by your doctor. This is important. Contact a doctor if:  You have more redness, swelling, or pain around your wound.  Your wound feels warm to the touch.  You have a fever or chills.  You have a general feeling of sickness (malaise).  You feel sick to your stomach (nauseous).  You throw up (vomit).  You have pain that does not get  better. Get help right away if:  You have a red streak going away from your wound.  You have any of these coming from your wound: ? Non-clear fluid. ? More blood. ? Pus or a bad smell.  You have trouble moving your injured area.  You lose feeling (have numbness) or feel tingling anywhere on your body. Summary  It is important to get the right medical treatment for animal bites. Treatment can help you to not get an infection. Ask your doctor if you need treatment to prevent an infection that can spread from animals to humans (rabies).  Check your wound every day for signs of infection, such as more redness or swelling instead of less.  If you have a red streak going away from your wound, get medical help right away. This information is not intended to replace advice given to you by your health care provider. Make sure you discuss any questions you have with your health care provider. Document Released: 02/26/2005 Document Revised: 09/06/2016 Document Reviewed: 09/06/2016 Elsevier Interactive Patient Education  2019 [employer]  

## 2018-06-26 NOTE — Assessment & Plan Note (Addendum)
Acute and improving.  Continue Augmentin until course complete, along with Mupirocin to wound.  Continue daily temperatures.  Return to office for worsening symptoms.

## 2018-06-26 NOTE — Progress Notes (Addendum)
Ht 5\' 11"  (1.803 m)   Wt 220 lb (99.8 kg)   BMI 30.68 kg/m    Subjective:    Patient ID: Brad Patrick, male    DOB: 12/05/1955, 63 y.o.   MRN: [id number]  HPI: Brad Patrick is a 63 y.o. male  Chief Complaint  Patient presents with  . Animal Bite    Follow-up, patient states his leg is better.     . This visit was completed via WebEx due to the restrictions of the COVID-19 pandemic. All issues as above were discussed and addressed. Physical exam was done as above through visual confirmation on WebEx. If it was felt that the patient should be evaluated in the office, they were directed there. The patient verbally consented to this visit. . Location of the patient: home . Location of the provider: home . Those involved with this call:  . Provider: Marnee Guarneri, DNP, AGPCNP-C . CMA: Merilyn Baba, Mazie . Front Desk/Registration: Don Perking  . Time spent on call: 15 minutes with patient face to face via video conference. More than 50% of this time was spent in counseling and coordination of care. 5 minutes total spent in review of patient's record and preparation of their chart.   DOG BITE TO RIGHT LOWER LEG: Treated on 06/19/2018 for a dog bite sustained to his right lower leg.  Treatment included Augmentin and Mupirocin, has three days of Augmentin left.   Last Tdap 05/14/2018.   Reports overall improvement to wound.  Denies fever, tenderness, warmth, edema, or drainage to area.  States he has full movement of extremity without pain.  Has h/o prostate CA, but has not undergone treatment in 2 1/2 years.  Reports his temperatures have been in 98 range.  Denies SOB, CP, chills.  States his wife, who is a retired Therapist, sports, has been marking the wound areas as instructed and there is no increase in redness.  Reports areas have decreased in redness and there is no longer any drainage.  Dressing this morning had no drainage per his report. He denies pain to areas.    Relevant past  medical, surgical, family and social history reviewed and updated as indicated. Interim medical history since our last visit reviewed. Allergies and medications reviewed and updated.  Review of Systems  Constitutional: Negative for activity change, diaphoresis, fatigue and fever.  Respiratory: Negative for cough, chest tightness, shortness of breath and wheezing.   Cardiovascular: Negative for chest pain, palpitations and leg swelling.  Gastrointestinal: Negative for abdominal distention, abdominal pain, constipation, diarrhea, nausea and vomiting.  Musculoskeletal: Negative.   Skin: Positive for wound.  Neurological: Negative for dizziness, syncope, weakness, light-headedness, numbness and headaches.  Psychiatric/Behavioral: Negative.     Per HPI unless specifically indicated above     Objective:    Ht 5\' 11"  (1.803 m)   Wt 220 lb (99.8 kg)   BMI 30.68 kg/m   Wt Readings from Last 3 Encounters:  06/26/18 220 lb (99.8 kg)  05/14/18 220 lb (99.8 kg)  05/09/18 226 lb 4.8 oz (102.6 kg)    Physical Exam  Constitutional: He is oriented to person, place, and time and well-developed, well-nourished, and in no distress. He does not have a sickly appearance.  HENT:  Head: Normocephalic and atraumatic.  Eyes: Conjunctivae are normal.  Neck: Normal range of motion.  Cardiovascular:  Unable to auscultate due to virtual visit  Pulmonary/Chest: Effort normal. No respiratory distress.  Unable to auscultate due to virtual visit  Neurological: He  is alert and oriented to person, place, and time.  Skin: Skin is warm and dry.  Visual via virtual: Dog bite present to right lower leg on lateral aspect of lower calf.  X 1  approx 1/2 cm area of crusting to anterior and x 1 approx 1/2 cm area of crusting to posterior aspect.  No drainage visualized.  Scant erythema present to exterior of wounds.  Patient reports no warmth or edema on palpation of area.  He reports no edema to BLE.  Full passive ROM  BLE.  Overall improvement noted from previous exam.  Psychiatric: Mood, memory, affect and judgment normal.    Results for orders placed or performed in visit on 05/14/18  Hepatitis C antibody  Result Value Ref Range   Hep C Virus Ab <0.1 0.0 - 0.9 s/co ratio  HIV Antibody (routine testing w rflx)  Result Value Ref Range   HIV Screen 4th Generation wRfx Non Reactive Non Reactive  CBC with Differential/Platelet  Result Value Ref Range   WBC 4.0 3.4 - 10.8 x10E3/uL   RBC 5.10 4.14 - 5.80 x10E6/uL   Hemoglobin 15.1 13.0 - 17.7 g/dL   Hematocrit 43.1 37.5 - 51.0 %   MCV 85 79 - 97 fL   MCH 29.6 26.6 - 33.0 pg   MCHC 35.0 31.5 - 35.7 g/dL   RDW 12.9 11.6 - 15.4 %   Platelets 247 150 - 450 x10E3/uL   Neutrophils 68 Not Estab. %   Lymphs 14 Not Estab. %   Monocytes 10 Not Estab. %   Eos 6 Not Estab. %   Basos 1 Not Estab. %   Neutrophils Absolute 2.7 1.4 - 7.0 x10E3/uL   Lymphocytes Absolute 0.6 (L) 0.7 - 3.1 x10E3/uL   Monocytes Absolute 0.4 0.1 - 0.9 x10E3/uL   EOS (ABSOLUTE) 0.3 0.0 - 0.4 x10E3/uL   Basophils Absolute 0.0 0.0 - 0.2 x10E3/uL   Immature Granulocytes 1 Not Estab. %   Immature Grans (Abs) 0.0 0.0 - 0.1 x10E3/uL  Comprehensive metabolic panel  Result Value Ref Range   Glucose 102 (H) 65 - 99 mg/dL   BUN 12 8 - 27 mg/dL   Creatinine, Ser 0.90 0.76 - 1.27 mg/dL   GFR calc non Af Amer 91 >59 mL/min/1.73   GFR calc Af Amer 105 >59 mL/min/1.73   BUN/Creatinine Ratio 13 10 - 24   Sodium 139 134 - 144 mmol/L   Potassium 4.5 3.5 - 5.2 mmol/L   Chloride 102 96 - 106 mmol/L   CO2 22 20 - 29 mmol/L   Calcium 9.0 8.6 - 10.2 mg/dL   Total Protein 6.7 6.0 - 8.5 g/dL   Albumin 4.2 3.8 - 4.8 g/dL   Globulin, Total 2.5 1.5 - 4.5 g/dL   Albumin/Globulin Ratio 1.7 1.2 - 2.2   Bilirubin Total 0.4 0.0 - 1.2 mg/dL   Alkaline Phosphatase 63 39 - 117 IU/L   AST 19 0 - 40 IU/L   ALT 26 0 - 44 IU/L  Lipid Panel w/o Chol/HDL Ratio  Result Value Ref Range   Cholesterol, Total 207  (H) 100 - 199 mg/dL   Triglycerides 92 0 - 149 mg/dL   HDL 61 >39 mg/dL   VLDL Cholesterol Cal 18 5 - 40 mg/dL   LDL Calculated 128 (H) 0 - 99 mg/dL  TSH  Result Value Ref Range   TSH 3.680 0.450 - 4.500 uIU/mL      Assessment & Plan:   Problem List Items Addressed  This Visit      Other   Dog bite of lower leg, right, subsequent encounter    Acute and improving.  Continue Augmentin until course complete, along with Mupirocin to wound.  Continue daily temperatures.  Return to office for worsening symptoms.           I discussed the assessment and treatment plan with the patient. The patient was provided an opportunity to ask questions and all were answered. The patient agreed with the plan and demonstrated an understanding of the instructions.   The patient was advised to call back or seek an in-person evaluation if the symptoms worsen or if the condition fails to improve as anticipated.   Follow up plan: Return if symptoms worsen or fail to improve.

## 2018-08-19 ENCOUNTER — Encounter: Payer: Self-pay | Admitting: Nurse Practitioner

## 2018-08-19 ENCOUNTER — Ambulatory Visit (INDEPENDENT_AMBULATORY_CARE_PROVIDER_SITE_OTHER): Payer: BLUE CROSS/BLUE SHIELD | Admitting: Nurse Practitioner

## 2018-08-19 ENCOUNTER — Other Ambulatory Visit: Payer: Self-pay

## 2018-08-19 VITALS — BP 138/88 | HR 81 | Temp 98.6°F

## 2018-08-19 DIAGNOSIS — L989 Disorder of the skin and subcutaneous tissue, unspecified: Secondary | ICD-10-CM

## 2018-08-19 NOTE — Patient Instructions (Signed)
Actinic Keratosis  An actinic keratosis is a precancerous growth on the skin. If there is more than one growth, the condition is called actinic keratoses. Actinic keratoses appear most often on areas of skin that get a lot of sun exposure, including the scalp, face, ears, lips, upper back, forearms, and the backs of the hands.  If left untreated, these growths may develop into a skin cancer called squamous cell carcinoma. It is important to have all these growths checked by a health care provider to determine the best treatment approach.  What are the causes?  Actinic keratoses are caused by getting too much ultraviolet (UV) radiation from the sun or other UV light sources.  What increases the risk?  You are more likely to develop this condition if you:  · Have light-colored skin and blue eyes.  · Have blond or red hair.  · Spend a lot of time in the sun.  · Do not protect your skin from the sun when outdoors.  · Are an older person. The risk of developing an actinic keratosis increases with age.  What are the signs or symptoms?  Actinic keratoses feel like scaly, rough spots of skin. Symptoms of this condition include growths that may:  · Be as small as a pinhead or as big as a quarter.  · Itch, hurt, or feel sensitive.  · Be skin-colored, light tan, dark tan, pink, or a combination of any of these colors. In most cases, the growths become red.  · Have a small piece of pink or gray skin (skin tag) growing from them.  It may be easier to notice actinic keratoses by feeling them, rather than seeing them. Sometimes, actinic keratoses disappear, but many reappear a few days to a few weeks later.  How is this diagnosed?  This condition is usually diagnosed with a physical exam.  · A tissue sample may be removed from the actinic keratosis and examined under a microscope (biopsy).  How is this treated?  If needed, this condition may be treated by:  · Scraping off the actinic keratosis (curettage).  · Freezing the actinic  keratosis with liquid nitrogen (cryosurgery). This causes the growth to eventually fall off the skin.  · Applying medicated creams or gels to destroy the cells in the growth.  · Applying chemicals to the actinic keratosis to make the outer layers of skin peel off (chemical peel).  · Using photodynamic therapy. In this procedure, medicated cream is applied to the actinic keratosis. This cream increases your skin's sensitivity to light. Then, a strong light is aimed at the actinic keratosis to destroy cells in the growth.  Follow these instructions at home:  Skin care  · Apply cool, wet cloths (cool compresses) to the affected areas.  · Do not scratch your skin.  · Check your skin regularly for any growths, especially growths that:  ? Start to itch or bleed.  ? Change in size, shape, or color.  Caring for the treated area  · Keep the treated area clean and dry as told by your health care provider.  · Do not apply any medicine, cream, or lotion to the treated area unless your health care provider tells you to do that.  · Do not pick at blisters or try to break them open. This can cause infection and scarring.  · If you have red or irritated skin after treatment, follow instructions from your health care provider about how to take care of the treated area.   Make sure you:  ? Wash your hands with soap and water before you change your bandage (dressing). If soap and water are not available, use hand sanitizer.  ? Change your dressing as told by your health care provider.  · If you have red or irritated skin after treatment, check your treated area every day for signs of infection. Check for:  ? Redness, swelling, or pain.  ? Fluid or blood.  ? Warmth.  ? Pus or a bad smell.  General instructions  · Take or apply over-the-counter and prescription medicines only as told by your health care provider.  · Return to your normal activities as told by your health care provider. Ask your health care provider what activities are  safe for you.  · Have a skin exam done every year by a health care provider who is a skin specialist (dermatologist).  · Keep all follow-up visits as told by your health care provider. This is important.  Lifestyle  · Do not use any products that contain nicotine or tobacco, such as cigarettes and e-cigarettes. If you need help quitting, ask your health care provider.  · Take steps to protect your skin from the sun.  ? Try to avoid the sun between 10:00 a.m. and 4:00 p.m. This is when the UV light is the strongest.  ? Use a sunscreen or sunblock with SPF 30 (sun protection factor 30) or greater.  ? Apply sunscreen before you are exposed to sunlight and reapply as often as directed by the instructions on the sunscreen container.  ? Always wear sunglasses that have UV protection, and always wear a hat and clothing to protect your skin from sunlight.  ? When possible, avoid medicines that increase your sensitivity to sunlight.  ? Do not use tanning beds or other indoor tanning devices.  Contact a health care provider if:  · You notice any changes or new growths on your skin.  · You have swelling, pain, or more redness around your treated area.  · You have fluid or blood coming from your treated area.  · Your treated area feels warm to the touch.  · You have pus or a bad smell coming from your treated area.  · You have a fever.  · You have a blister that becomes large and painful.  Summary  · An actinic keratosis is a precancerous growth on the skin. If there is more than one growth, the condition is called actinic keratoses. In some cases, if left untreated, these growths can develop into skin cancer.  · Check your skin regularly for any growths, especially growths that start to itch or bleed, or change in size, shape, or color.  · Take steps to protect your skin from the sun.  · Contact a health care provider if you notice any changes or new growths on your skin.  · Keep all follow-up visits as told by your health  care provider. This is important.  This information is not intended to replace advice given to you by your health care provider. Make sure you discuss any questions you have with your health care provider.  Document Released: 05/25/2008 Document Revised: 07/09/2017 Document Reviewed: 07/09/2017  Elsevier Interactive Patient Education © 2019 [employer]

## 2018-08-19 NOTE — Progress Notes (Signed)
BP 138/88   Pulse 81   Temp 98.6 F (37 C) (Oral)   SpO2 99%    Subjective:    Patient ID: Brad Patrick, male    DOB: [birth year], 63 y.o.   MRN: [id number]  HPI: Brad Patrick is a 63 y.o. male  Chief Complaint  Patient presents with  . Referral    pt states he has a spot on his nose that he first noticed around a month ago, thinks he may need a referral to derm    SKIN LESION Reports area on his nose that "just came up" and his wife wished to have area looked at.  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  Wears hat and uses sunscreen often.  No history of skin cancer. Duration: months Location: tip of nose Painful: no Itching: no Onset: sudden Context: changing Associated signs and symptoms: increased size and change in color History of skin cancer: no History of precancerous skin lesions: no Family history of skin cancer: no  Relevant past medical, surgical, family and social history reviewed and updated as indicated. Interim medical history since our last visit reviewed. Allergies and medications reviewed and updated.  Review of Systems  Constitutional: Negative for activity change, diaphoresis, fatigue and fever.  Respiratory: Negative for cough, chest tightness, shortness of breath and wheezing.   Cardiovascular: Negative for chest pain, palpitations and leg swelling.  Gastrointestinal: Negative for abdominal distention, abdominal pain, constipation, diarrhea, nausea and vomiting.  Skin: Positive for color change.  Psychiatric/Behavioral: Negative.     Per HPI unless specifically indicated above     Objective:    BP 138/88   Pulse 81   Temp 98.6 F (37 C) (Oral)   SpO2 99%   Wt Readings from Last 3 Encounters:  06/26/18 220 lb (99.8 kg)  05/14/18 220 lb (99.8 kg)  05/09/18 226 lb 4.8 oz (102.6 kg)    Physical Exam Vitals signs and nursing note reviewed.  Constitutional:      Appearance: He is well-developed.  HENT:     Head:  Normocephalic and atraumatic.     Right Ear: Hearing normal. No drainage.     Left Ear: Hearing normal. No drainage.     Mouth/Throat:     Pharynx: Uvula midline.  Eyes:     General: Lids are normal.        Right eye: No discharge.        Left eye: No discharge.     Conjunctiva/sclera: Conjunctivae normal.     Pupils: Pupils are equal, round, and reactive to light.  Neck:     Musculoskeletal: Normal range of motion and neck supple.  Cardiovascular:     Rate and Rhythm: Normal rate and regular rhythm.     Heart sounds: Normal heart sounds, S1 normal and S2 normal. No murmur. No gallop.   Pulmonary:     Effort: Pulmonary effort is normal.     Breath sounds: Normal breath sounds.  Abdominal:     General: Bowel sounds are normal.     Palpations: Abdomen is soft. There is no hepatomegaly or splenomegaly.  Musculoskeletal: Normal range of motion.     Right lower leg: No edema.     Left lower leg: No edema.  Skin:    General: Skin is warm and dry.     Capillary Refill: Capillary refill takes less than 2 seconds.     Findings: Lesion present.     Comments: Small, approx 1/2 cm,  lesion to tip of right side nose.  Area raised, pink, with crusting centrally.  No asymmetry.  Regular borders.  No drainage.  Neurological:     Mental Status: He is alert and oriented to person, place, and time.  Psychiatric:        Mood and Affect: Mood normal.        Behavior: Behavior normal.        Thought Content: Thought content normal.        Judgment: Judgment normal.     Results for orders placed or performed in visit on 05/14/18  Hepatitis C antibody  Result Value Ref Range   Hep C Virus Ab <0.1 0.0 - 0.9 s/co ratio  HIV Antibody (routine testing w rflx)  Result Value Ref Range   HIV Screen 4th Generation wRfx Non Reactive Non Reactive  CBC with Differential/Platelet  Result Value Ref Range   WBC 4.0 3.4 - 10.8 x10E3/uL   RBC 5.10 4.14 - 5.80 x10E6/uL   Hemoglobin 15.1 13.0 - 17.7 g/dL    Hematocrit 43.1 37.5 - 51.0 %   MCV 85 79 - 97 fL   MCH 29.6 26.6 - 33.0 pg   MCHC 35.0 31.5 - 35.7 g/dL   RDW 12.9 11.6 - 15.4 %   Platelets 247 150 - 450 x10E3/uL   Neutrophils 68 Not Estab. %   Lymphs 14 Not Estab. %   Monocytes 10 Not Estab. %   Eos 6 Not Estab. %   Basos 1 Not Estab. %   Neutrophils Absolute 2.7 1.4 - 7.0 x10E3/uL   Lymphocytes Absolute 0.6 (L) 0.7 - 3.1 x10E3/uL   Monocytes Absolute 0.4 0.1 - 0.9 x10E3/uL   EOS (ABSOLUTE) 0.3 0.0 - 0.4 x10E3/uL   Basophils Absolute 0.0 0.0 - 0.2 x10E3/uL   Immature Granulocytes 1 Not Estab. %   Immature Grans (Abs) 0.0 0.0 - 0.1 x10E3/uL  Comprehensive metabolic panel  Result Value Ref Range   Glucose 102 (H) 65 - 99 mg/dL   BUN 12 8 - 27 mg/dL   Creatinine, Ser 0.90 0.76 - 1.27 mg/dL   GFR calc non Af Amer 91 >59 mL/min/1.73   GFR calc Af Amer 105 >59 mL/min/1.73   BUN/Creatinine Ratio 13 10 - 24   Sodium 139 134 - 144 mmol/L   Potassium 4.5 3.5 - 5.2 mmol/L   Chloride 102 96 - 106 mmol/L   CO2 22 20 - 29 mmol/L   Calcium 9.0 8.6 - 10.2 mg/dL   Total Protein 6.7 6.0 - 8.5 g/dL   Albumin 4.2 3.8 - 4.8 g/dL   Globulin, Total 2.5 1.5 - 4.5 g/dL   Albumin/Globulin Ratio 1.7 1.2 - 2.2   Bilirubin Total 0.4 0.0 - 1.2 mg/dL   Alkaline Phosphatase 63 39 - 117 IU/L   AST 19 0 - 40 IU/L   ALT 26 0 - 44 IU/L  Lipid Panel w/o Chol/HDL Ratio  Result Value Ref Range   Cholesterol, Total 207 (H) 100 - 199 mg/dL   Triglycerides 92 0 - 149 mg/dL   HDL 61 >39 mg/dL   VLDL Cholesterol Cal 18 5 - 40 mg/dL   LDL Calculated 128 (H) 0 - 99 mg/dL  TSH  Result Value Ref Range   TSH 3.680 0.450 - 4.500 uIU/mL      Assessment & Plan:   Problem List Items Addressed This Visit      Musculoskeletal and Integument   Skin lesion - Primary    To  tip of nose, 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  Possible actinic keratosis, will place referral to dermatology for further evaluation due to recent changes in size and color per  patient report.  Return if worsening or continued symptoms.  Follow with dermatology.      Relevant Orders   Ambulatory referral to Dermatology       Follow up plan: Return if symptoms worsen or fail to improve.

## 2018-08-19 NOTE — Assessment & Plan Note (Signed)
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  Possible actinic keratosis, will place referral to dermatology for further evaluation due to recent changes in size and color per patient report.  Return if worsening or continued symptoms.  Follow with dermatology.

## 2018-10-09 ENCOUNTER — Inpatient Hospital Stay: Payer: BLUE CROSS/BLUE SHIELD | Attending: Radiation Oncology

## 2018-10-16 ENCOUNTER — Ambulatory Visit: Payer: BLUE CROSS/BLUE SHIELD | Attending: Radiation Oncology | Admitting: Radiation Oncology

## 2018-11-13 ENCOUNTER — Ambulatory Visit: Payer: BLUE CROSS/BLUE SHIELD | Admitting: Nurse Practitioner

## 2018-11-26 ENCOUNTER — Encounter: Payer: Self-pay | Admitting: Nurse Practitioner

## 2018-11-26 ENCOUNTER — Other Ambulatory Visit: Payer: Self-pay

## 2018-11-26 ENCOUNTER — Ambulatory Visit (INDEPENDENT_AMBULATORY_CARE_PROVIDER_SITE_OTHER): Payer: BLUE CROSS/BLUE SHIELD | Admitting: Nurse Practitioner

## 2018-11-26 VITALS — BP 128/80 | HR 80 | Temp 98.4°F | Ht 71.0 in | Wt 222.0 lb

## 2018-11-26 DIAGNOSIS — R7301 Impaired fasting glucose: Secondary | ICD-10-CM | POA: Diagnosis not present

## 2018-11-26 DIAGNOSIS — E7800 Pure hypercholesterolemia, unspecified: Secondary | ICD-10-CM | POA: Diagnosis not present

## 2018-11-26 DIAGNOSIS — Z1211 Encounter for screening for malignant neoplasm of colon: Secondary | ICD-10-CM | POA: Diagnosis not present

## 2018-11-26 DIAGNOSIS — I10 Essential (primary) hypertension: Secondary | ICD-10-CM | POA: Diagnosis not present

## 2018-11-26 NOTE — Assessment & Plan Note (Signed)
Chronic, no current medications.  Recent ASCVD 13.4%.  Have had at length discussions with him on statin use and at this time he wishes to continue diet focus and recheck lipid panel.  Return in 6 months.

## 2018-11-26 NOTE — Assessment & Plan Note (Signed)
Chronic, ongoing with initial BP elevated but repeat below goal.  Recommend he check BP at home at least three mornings a week and document (his wife is a retired nurse).  Continue current medication regimen and adjust as needed.  CMP today. 

## 2018-11-26 NOTE — Assessment & Plan Note (Signed)
Check A1C today

## 2018-11-26 NOTE — Progress Notes (Signed)
BP 128/80 (BP Location: Left Arm)   Pulse 80   Temp 98.4 F (36.9 C) (Oral)   Ht 5\' 11"  (1.803 m)   Wt 222 lb (100.7 kg)   SpO2 98%   BMI 30.96 kg/m    Subjective:    Patient ID: Brad Patrick, male    DOB: 01/29/1956, 63 y.o.   MRN: [id number]  HPI: Brad Patrick is a 63 y.o. male  Chief Complaint  Patient presents with  . Follow-up     pt wants cologuard  . Hypertension  . Hyperlipidemia   HYPERTENSION / HYPERLIPIDEMIA Currently on Bisoprolol-HCTZ.  No current cholesterol medications, has refused these. Satisfied with current treatment? yes Duration of hypertension: chronic BP monitoring frequency: not checking BP range:  Duration of hyperlipidemia: chronic Cholesterol medication side effects: no Cholesterol supplements: none Medication compliance: good compliance Aspirin: no Recent stressors: no Recurrent headaches: no Visual changes: no Palpitations: no Dyspnea: no Chest pain: no Lower extremity edema: no Dizzy/lightheaded: no   The 10-year ASCVD risk score Mikey Bussing DC Jr., et al., 2013) is: 10.6%   Values used to calculate the score:     Age: 71 years     Sex: Male     Is Non-Hispanic African American: No     Diabetic: No     Tobacco smoker: No     Systolic Blood Pressure: 0000000 mmHg     Is BP treated: Yes     HDL Cholesterol: 61 mg/dL     Total Cholesterol: 207 mg/dL   IFG: Glucose on labs over past three years range from 100 to 120.  Discussed checking A1C with him today.  His mother has T2DM. Polydipsia/polyuria: no Visual disturbance: no Chest pain: no Paresthesias: no  Relevant past medical, surgical, family and social history reviewed and updated as indicated. Interim medical history since our last visit reviewed. Allergies and medications reviewed and updated.  Review of Systems  Constitutional: Negative for activity change, diaphoresis, fatigue and fever.  Respiratory: Negative for cough, chest tightness, shortness of breath and  wheezing.   Cardiovascular: Negative for chest pain, palpitations and leg swelling.  Gastrointestinal: Negative for abdominal distention, abdominal pain, constipation, diarrhea, nausea and vomiting.  Neurological: Negative for dizziness, syncope, weakness, light-headedness, numbness and headaches.  Psychiatric/Behavioral: Negative.     Per HPI unless specifically indicated above     Objective:    BP 128/80 (BP Location: Left Arm)   Pulse 80   Temp 98.4 F (36.9 C) (Oral)   Ht 5\' 11"  (1.803 m)   Wt 222 lb (100.7 kg)   SpO2 98%   BMI 30.96 kg/m   Wt Readings from Last 3 Encounters:  11/26/18 222 lb (100.7 kg)  06/26/18 220 lb (99.8 kg)  05/14/18 220 lb (99.8 kg)    Physical Exam Vitals signs and nursing note reviewed.  Constitutional:      General: He is awake. He is not in acute distress.    Appearance: He is well-developed and overweight. He is not ill-appearing.  HENT:     Head: Normocephalic and atraumatic.     Right Ear: Hearing normal. No drainage.     Left Ear: Hearing normal. No drainage.  Eyes:     General: Lids are normal.        Right eye: No discharge.        Left eye: No discharge.     Conjunctiva/sclera: Conjunctivae normal.     Pupils: Pupils are equal, round, and reactive to light.  Neck:     Musculoskeletal: Normal range of motion and neck supple.     Thyroid: No thyromegaly.     Vascular: No carotid bruit.  Cardiovascular:     Rate and Rhythm: Normal rate and regular rhythm.     Heart sounds: Normal heart sounds, S1 normal and S2 normal. No murmur. No gallop.   Pulmonary:     Effort: Pulmonary effort is normal.     Breath sounds: Normal breath sounds.  Abdominal:     General: Bowel sounds are normal.     Palpations: Abdomen is soft.  Musculoskeletal: Normal range of motion.     Right lower leg: No edema.     Left lower leg: No edema.  Skin:    General: Skin is warm and dry.  Neurological:     Mental Status: He is alert and oriented to  person, place, and time.  Psychiatric:        Attention and Perception: Attention normal.        Mood and Affect: Mood normal.        Behavior: Behavior normal. Behavior is cooperative.        Thought Content: Thought content normal.        Judgment: Judgment normal.     Results for orders placed or performed in visit on 05/14/18  Hepatitis C antibody  Result Value Ref Range   Hep C Virus Ab <0.1 0.0 - 0.9 s/co ratio  HIV Antibody (routine testing w rflx)  Result Value Ref Range   HIV Screen 4th Generation wRfx Non Reactive Non Reactive  CBC with Differential/Platelet  Result Value Ref Range   WBC 4.0 3.4 - 10.8 x10E3/uL   RBC 5.10 4.14 - 5.80 x10E6/uL   Hemoglobin 15.1 13.0 - 17.7 g/dL   Hematocrit 43.1 37.5 - 51.0 %   MCV 85 79 - 97 fL   MCH 29.6 26.6 - 33.0 pg   MCHC 35.0 31.5 - 35.7 g/dL   RDW 12.9 11.6 - 15.4 %   Platelets 247 150 - 450 x10E3/uL   Neutrophils 68 Not Estab. %   Lymphs 14 Not Estab. %   Monocytes 10 Not Estab. %   Eos 6 Not Estab. %   Basos 1 Not Estab. %   Neutrophils Absolute 2.7 1.4 - 7.0 x10E3/uL   Lymphocytes Absolute 0.6 (L) 0.7 - 3.1 x10E3/uL   Monocytes Absolute 0.4 0.1 - 0.9 x10E3/uL   EOS (ABSOLUTE) 0.3 0.0 - 0.4 x10E3/uL   Basophils Absolute 0.0 0.0 - 0.2 x10E3/uL   Immature Granulocytes 1 Not Estab. %   Immature Grans (Abs) 0.0 0.0 - 0.1 x10E3/uL  Comprehensive metabolic panel  Result Value Ref Range   Glucose 102 (H) 65 - 99 mg/dL   BUN 12 8 - 27 mg/dL   Creatinine, Ser 0.90 0.76 - 1.27 mg/dL   GFR calc non Af Amer 91 >59 mL/min/1.73   GFR calc Af Amer 105 >59 mL/min/1.73   BUN/Creatinine Ratio 13 10 - 24   Sodium 139 134 - 144 mmol/L   Potassium 4.5 3.5 - 5.2 mmol/L   Chloride 102 96 - 106 mmol/L   CO2 22 20 - 29 mmol/L   Calcium 9.0 8.6 - 10.2 mg/dL   Total Protein 6.7 6.0 - 8.5 g/dL   Albumin 4.2 3.8 - 4.8 g/dL   Globulin, Total 2.5 1.5 - 4.5 g/dL   Albumin/Globulin Ratio 1.7 1.2 - 2.2   Bilirubin Total 0.4 0.0 - 1.2 mg/dL  Alkaline Phosphatase 63 39 - 117 IU/L   AST 19 0 - 40 IU/L   ALT 26 0 - 44 IU/L  Lipid Panel w/o Chol/HDL Ratio  Result Value Ref Range   Cholesterol, Total 207 (H) 100 - 199 mg/dL   Triglycerides 92 0 - 149 mg/dL   HDL 61 >39 mg/dL   VLDL Cholesterol Cal 18 5 - 40 mg/dL   LDL Calculated 128 (H) 0 - 99 mg/dL  TSH  Result Value Ref Range   TSH 3.680 0.450 - 4.500 uIU/mL      Assessment & Plan:   Problem List Items Addressed This Visit      Other   Pure hypercholesterolemia   Relevant Orders   Comprehensive metabolic panel   Lipid Panel w/o Chol/HDL Ratio    Other Visit Diagnoses    Colon cancer screening    -  Primary   Relevant Orders   Cologuard   IFG (impaired fasting glucose)       Relevant Orders   HgB A1c       Follow up plan: Return in about 6 months (around 05/26/2019) for Annual physical.

## 2018-11-26 NOTE — Patient Instructions (Signed)

## 2018-11-27 LAB — COMPREHENSIVE METABOLIC PANEL
ALT: 40 IU/L (ref 0–44)
AST: 23 IU/L (ref 0–40)
Albumin/Globulin Ratio: 1.8 (ref 1.2–2.2)
Albumin: 4.6 g/dL (ref 3.8–4.8)
Alkaline Phosphatase: 52 IU/L (ref 39–117)
BUN/Creatinine Ratio: 14 (ref 10–24)
BUN: 13 mg/dL (ref 8–27)
Bilirubin Total: 0.5 mg/dL (ref 0.0–1.2)
CO2: 24 mmol/L (ref 20–29)
Calcium: 9.6 mg/dL (ref 8.6–10.2)
Chloride: 104 mmol/L (ref 96–106)
Creatinine, Ser: 0.9 mg/dL (ref 0.76–1.27)
GFR calc Af Amer: 105 mL/min/{1.73_m2} (ref >59)
GFR calc non Af Amer: 91 mL/min/{1.73_m2} (ref >59)
Globulin, Total: 2.5 g/dL (ref 1.5–4.5)
Glucose: 117 mg/dL — ABNORMAL HIGH (ref 65–99)
Potassium: 5.1 mmol/L (ref 3.5–5.2)
Sodium: 141 mmol/L (ref 134–144)
Total Protein: 7.1 g/dL (ref 6.0–8.5)

## 2018-11-27 LAB — HEMOGLOBIN A1C
Est. average glucose Bld gHb Est-mCnc: 105 mg/dL
Hgb A1c MFr Bld: 5.3 % (ref 4.8–5.6)

## 2018-11-27 LAB — LIPID PANEL W/O CHOL/HDL RATIO
Cholesterol, Total: 252 mg/dL — ABNORMAL HIGH (ref 100–199)
HDL: 67 mg/dL (ref >39)
LDL Chol Calc (NIH): 169 mg/dL — ABNORMAL HIGH (ref 0–99)
Triglycerides: 92 mg/dL (ref 0–149)
VLDL Cholesterol Cal: 16 mg/dL (ref 5–40)

## 2018-12-05 ENCOUNTER — Telehealth: Payer: Self-pay

## 2018-12-05 NOTE — Telephone Encounter (Signed)
Copied from [place] [id number]. Topic: General - Other >> Dec 05, 2018  2:45 PM Rayann Heman wrote: Reason for CRM: pt called and stated that he would like someone to call him to give recent labs. Please advise

## 2018-12-05 NOTE — Telephone Encounter (Signed)
Patient notified

## 2018-12-05 NOTE — Telephone Encounter (Signed)
Brad Patrick,  It says you contacted patient via [place]. But I'm unable to see the information you provided for patient.  Is there something I need to relay to patient?

## 2018-12-05 NOTE — Telephone Encounter (Signed)
Just let him know I did send message through [place] and ask if he uses this.  His cholesterol levels are still elevated, I want him to continue to focus on diet and we will recheck next visit.  Everything else looked good.  Have a great day!!!

## 2019-03-28 ENCOUNTER — Other Ambulatory Visit: Payer: Self-pay | Admitting: Nurse Practitioner

## 2019-04-27 ENCOUNTER — Other Ambulatory Visit: Payer: 59

## 2019-04-27 ENCOUNTER — Other Ambulatory Visit: Payer: Self-pay

## 2019-04-27 ENCOUNTER — Other Ambulatory Visit: Payer: Self-pay | Admitting: *Deleted

## 2019-04-27 DIAGNOSIS — Z8546 Personal history of malignant neoplasm of prostate: Secondary | ICD-10-CM

## 2019-04-28 ENCOUNTER — Encounter: Payer: Self-pay | Admitting: Urology

## 2019-04-28 ENCOUNTER — Ambulatory Visit (INDEPENDENT_AMBULATORY_CARE_PROVIDER_SITE_OTHER): Payer: 59 | Admitting: Urology

## 2019-04-28 VITALS — BP 160/101 | HR 88 | Ht 71.0 in | Wt 228.0 lb

## 2019-04-28 DIAGNOSIS — Z8546 Personal history of malignant neoplasm of prostate: Secondary | ICD-10-CM | POA: Diagnosis not present

## 2019-04-28 DIAGNOSIS — N393 Stress incontinence (female) (male): Secondary | ICD-10-CM

## 2019-04-28 LAB — PSA: Prostate Specific Ag, Serum: <0.1 ng/mL (ref 0.0–4.0)

## 2019-04-28 NOTE — Progress Notes (Signed)
04/28/2019 11:32 AM   Brad Patrick 1955/07/26 [id number]  Referring provider: [name], MD [place] [facility] [place],  [place] [address]  Chief Complaint  Patient presents with  . Prostate Cancer    HPI: 64 year old male with a personal history of prostate cancer as below who returns today for routine annual follow-up with PSA.  He underwent robot-assisted laparoscopic radical prostatectomy in 05/2015 for Gleason 4+3 prostate cancer.  Surgical pathology as below with evidence of extracapsular extension, bladder neck invasion and positive margins at the bladder neck.  His postoperative PSA was detectable.  PSA 33, postop PSA 0.3.  He underwent a course of Lupron for 16 months, last 36-month injection given 06/2016.  Salvage radiation was completed in 2017.  PSA remains undetectable as of yesterday.  He does mention today that he has been having some slightly increased difficulties with leaking a small amount of urine with heavy lifting which can be more irritating.  He has gained a little bit of weight and not doing pelvic floor exercises.   PMH: Past Medical History:  Diagnosis Date  . Elevated PSA   . HLD (hyperlipidemia)   . HTN (hypertension)   . Prostate cancer [facility])     Surgical History: Past Surgical History:  Procedure Laterality Date  . HERNIA REPAIR    . PELVIC LYMPH NODE DISSECTION N/A 05/17/2015   Procedure: PELVIC LYMPH NODE DISSECTION;  Surgeon: Hollice Espy, MD;  Location: [facility] ORS;  Service: Urology;  Laterality: N/A;  . ROBOT ASSISTED LAPAROSCOPIC RADICAL PROSTATECTOMY N/A 05/17/2015   Procedure: ROBOTIC ASSISTED LAPAROSCOPIC RADICAL PROSTATECTOMY;  Surgeon: Hollice Espy, MD;  Location: [facility] ORS;  Service: Urology;  Laterality: N/A;    Home Medications:  Allergies as of 04/28/2019   No Known Allergies     Medication List       Accurate as of April 28, 2019 11:32 AM. If you have any questions, ask your nurse or  doctor.        bisoprolol-hydrochlorothiazide 5-6.25 MG tablet Commonly known as: ZIAC TAKE 1 TABLET BY MOUTH ONCE DAILY   CLARITIN PO Take by mouth daily.       Allergies: No Known Allergies  Family History: Family History  Problem Relation Age of Onset  . Diabetes Mother   . Cancer Father   . Kidney disease Neg Hx   . Prostate cancer Neg Hx     Social History:  reports that he has never smoked. His smokeless tobacco use includes snuff. He reports current alcohol use of about 6.0 standard drinks of alcohol per week. He reports that he does not use drugs.   Physical Exam: BP (!) 160/101   Pulse 88   Ht 5\' 11"  (1.803 m)   Wt 228 lb (103.4 kg)   BMI 31.80 kg/m   Constitutional:  Alert and oriented, No acute distress. HEENT: [place] AT, moist mucus membranes.  Trachea midline, no masses. Cardiovascular: No clubbing, cyanosis, or edema. Respiratory: Normal respiratory effort, no increased work of breathing. Skin: No rashes, bruises or suspicious lesions. Neurologic: Grossly intact, no focal deficits, moving all 4 extremities. Psychiatric: Normal mood and affect.  Laboratory Data: Lab Results  Component Value Date   WBC 4.0 05/14/2018   HGB 15.1 05/14/2018   HCT 43.1 05/14/2018   MCV 85 05/14/2018   PLT 247 05/14/2018    Lab Results  Component Value Date   CREATININE 0.90 11/26/2018     Lab Results  Component Value Date  HGBA1C 5.3 11/26/2018   Assessment & Plan:    1. History of prostate cancer No evidence of disease  We discussed at this point in time, it is reasonable for him to follow-up with his primary care physician to have his PSA checked annually.  We discussed that his PSA needs to stay in the undetectable range.  If it rises any above undetectable, he should return.  He is agreeable this plan.  Alternatively, he can continue follow-up with [place] annually.  He prefer to follow-up with his primary care and have labs done with his routine annual labs.   2. Stress incontinence of urine Slightly increased bother although not severe  We discussed the role of weight loss and pelvic floor exercises.  Offered referral back to physical therapy.  Not interested in surgical intervention.  He like to try to self treat with exercises and lifestyle modification, will let us know if he like a referral back to physical therapy.    Hollice Espy, MD  [facility] [address], [place], [place] [address] [phone]

## 2019-04-28 NOTE — Patient Instructions (Signed)

## 2019-05-26 ENCOUNTER — Encounter: Payer: Self-pay | Admitting: Nurse Practitioner

## 2019-05-26 ENCOUNTER — Ambulatory Visit (INDEPENDENT_AMBULATORY_CARE_PROVIDER_SITE_OTHER): Payer: 59 | Admitting: Nurse Practitioner

## 2019-05-26 ENCOUNTER — Other Ambulatory Visit: Payer: Self-pay

## 2019-05-26 VITALS — BP 132/86 | HR 86 | Temp 97.7°F | Ht 70.0 in | Wt 228.0 lb

## 2019-05-26 DIAGNOSIS — E7800 Pure hypercholesterolemia, unspecified: Secondary | ICD-10-CM | POA: Diagnosis not present

## 2019-05-26 DIAGNOSIS — Z6832 Body mass index (BMI) 32.0-32.9, adult: Secondary | ICD-10-CM

## 2019-05-26 DIAGNOSIS — Z8546 Personal history of malignant neoplasm of prostate: Secondary | ICD-10-CM | POA: Diagnosis not present

## 2019-05-26 DIAGNOSIS — I10 Essential (primary) hypertension: Secondary | ICD-10-CM

## 2019-05-26 DIAGNOSIS — Z1211 Encounter for screening for malignant neoplasm of colon: Secondary | ICD-10-CM

## 2019-05-26 DIAGNOSIS — Z0000 Encounter for general adult medical examination without abnormal findings: Secondary | ICD-10-CM

## 2019-05-26 DIAGNOSIS — R7301 Impaired fasting glucose: Secondary | ICD-10-CM

## 2019-05-26 DIAGNOSIS — E6609 Other obesity due to excess calories: Secondary | ICD-10-CM

## 2019-05-26 MED ORDER — BISOPROLOL-HYDROCHLOROTHIAZIDE 5-6.25 MG PO TABS: 1.0000 | ORAL_TABLET | Freq: Every day | ORAL | 4 refills | Status: AC

## 2019-05-26 NOTE — Assessment & Plan Note (Signed)
Recommend continued focus on healthy diet choices and regular physical activity (30 minutes 5 days a week).  Focus on small goals and set timelines to achieve.

## 2019-05-26 NOTE — Progress Notes (Signed)
BP 132/86 (BP Location: Left Arm)   Pulse 86   Temp 97.7 F (36.5 C) (Oral)   Ht 5\' 10"  (1.778 m)   Wt 228 lb (103.4 kg)   SpO2 99%   BMI 32.71 kg/m    Subjective:    Patient ID: Brad Patrick, male    DOB: 1955-04-01, 64 y.o.   MRN: [id number]  HPI: Brad Patrick is a 64 y.o. male presenting on 05/26/2019 for comprehensive medical examination. Current medical complaints include:none  He currently lives with: wife Interim Problems from his last visit: no   HYPERTENSION / HYPERLIPIDEMIA Currently on Bisoprolol-HCTZ. No current cholesterol medications, has refused these. Satisfied with current treatment? yes Duration of hypertension: chronic BP monitoring frequency: not checking BP range:  Duration of hyperlipidemia: chronic Cholesterol medication side effects: no Cholesterol supplements: none Medication compliance: good compliance Aspirin: no Recent stressors: no Recurrent headaches: no Visual changes: no Palpitations: no Dyspnea: no Chest pain: no Lower extremity edema: no Dizzy/lightheaded: no  The 10-year ASCVD risk score Mikey Bussing DC Jr., et al., 2013) is: 13.3%   Values used to calculate the score:     Age: 17 years     Sex: Male     Is Non-Hispanic African American: No     Diabetic: No     Tobacco smoker: No     Systolic Blood Pressure: [id number] mmHg     Is BP treated: Yes     HDL Cholesterol: 67 mg/dL     Total Cholesterol: 252 mg/dL   H/O Prostate Cancer: Followed by Dr. Erlene Quan: last seen 04/28/2019. Gleason 4+3 prostate CA. Underwent a robot-assisted laparoscopic radical prostatectomy in March 2017 with radiation completed 2017. Then underwent Lupron course for 16 months. He is to have continued PSA checks annually with PCP and if rises above undetectable is to return to urology. Does have ED with poor response to PDE 5 medications and on discussion with Dr. Erlene Quan he has not wanted to trial injections. Recent February 2021 PSA <0.1.  IFG: Glucose  on labs over past three years range from 100 to 120, A1C 5.3% recent visit in September.  His mother has T2DM. Polydipsia/polyuria: no Visual disturbance: no Chest pain: no Paresthesias: no  Functional Status Survey: Is the patient deaf or have difficulty hearing?: No Does the patient have difficulty seeing, even when wearing glasses/contacts?: No Does the patient have difficulty concentrating, remembering, or making decisions?: No Does the patient have difficulty walking or climbing stairs?: No Does the patient have difficulty dressing or bathing?: No Does the patient have difficulty doing errands alone such as visiting a doctor's office or shopping?: No  FALL RISK: Fall Risk  05/14/2018 09/25/2017 09/06/2016 11/07/2015 06/27/2015  Falls in the past year? 0 No No No No  Number falls in past yr: 0 - - - -  Injury with Fall? 0 - - - -  Follow up Falls evaluation completed - - - -    Depression Screen Depression screen [facility] 2/9 05/26/2019 05/14/2018 04/22/2018 09/06/2016 11/07/2015  Decreased Interest 0 0 0 0 0  Down, Depressed, Hopeless 0 0 0 0 0  PHQ - 2 Score 0 0 0 0 0  Altered sleeping 0 0 0 - -  Tired, decreased energy 1 1 1  - -  Change in appetite 0 0 0 - -  Feeling bad or failure about yourself  0 0 0 - -  Trouble concentrating 0 0 0 - -  Moving slowly or fidgety/restless 0 0 0 - -  Suicidal thoughts 0 0 0 - -  PHQ-9 Score 1 1 1  - -  Difficult doing work/chores Not difficult at all Not difficult at all Not difficult at all - -    Advanced Directives <no information>  Past Medical History:  Past Medical History:  Diagnosis Date  . Elevated PSA   . HLD (hyperlipidemia)   . HTN (hypertension)   . Prostate cancer [facility])     Surgical History:  Past Surgical History:  Procedure Laterality Date  . HERNIA REPAIR    . PELVIC LYMPH NODE DISSECTION N/A 05/17/2015   Procedure: PELVIC LYMPH NODE DISSECTION;  Surgeon: Hollice Espy, MD;  Location: [facility] ORS;  Service: Urology;   Laterality: N/A;  . ROBOT ASSISTED LAPAROSCOPIC RADICAL PROSTATECTOMY N/A 05/17/2015   Procedure: ROBOTIC ASSISTED LAPAROSCOPIC RADICAL PROSTATECTOMY;  Surgeon: Hollice Espy, MD;  Location: [facility] ORS;  Service: Urology;  Laterality: N/A;    Medications:  Current Outpatient Medications on File Prior to Visit  Medication Sig  . Loratadine (CLARITIN PO) Take by mouth daily.   Current Facility-Administered Medications on File Prior to Visit  Medication  . leuprolide (LUPRON) injection 30 mg    Allergies:  No Known Allergies  Social History:  Social History   Socioeconomic History  . Marital status: Married    Spouse name: Not on file  . Number of children: Not on file  . Years of education: Not on file  . Highest education level: Not on file  Occupational History  . Not on file  Tobacco Use  . Smoking status: Never Smoker  . Smokeless tobacco: Current User    Types: Snuff  Substance and Sexual Activity  . Alcohol use: Yes    Alcohol/week: 6.0 standard drinks    Types: 6 Cans of beer per week    Comment: moderate 6 per day  . Drug use: No  . Sexual activity: Yes  Other Topics Concern  . Not on file  Social History Narrative  . Not on file   Social Determinants of Health   Financial Resource Strain:   . Difficulty of Paying Living Expenses:   Food Insecurity:   . Worried About Charity fundraiser in the Last Year:   . Arboriculturist in the Last Year:   Transportation Needs:   . Film/video editor (Medical):   [name] Lack of Transportation (Non-Medical):   Physical Activity:   . Days of Exercise per Week:   . Minutes of Exercise per Session:   Stress:   . Feeling of Stress :   Social Connections:   . Frequency of Communication with Friends and Family:   . Frequency of Social Gatherings with Friends and Family:   . Attends Religious Services:   . Active Member of Clubs or Organizations:   . Attends Archivist Meetings:   [name] Marital Status:   Intimate  Partner Violence:   . Fear of Current or Ex-Partner:   . Emotionally Abused:   [name] Physically Abused:   . Sexually Abused:    Social History   Tobacco Use  Smoking Status Never Smoker  Smokeless Tobacco Current User  . Types: Snuff   Social History   Substance and Sexual Activity  Alcohol Use Yes  . Alcohol/week: 6.0 standard drinks  . Types: 6 Cans of beer per week   Comment: moderate 6 per day    Family History:  Family History  Problem Relation Age of Onset  . Diabetes Mother   .  Cancer Father   . Kidney disease Neg Hx   . Prostate cancer Neg Hx     Past medical history, surgical history, medications, allergies, family history and social history reviewed with patient today and changes made to appropriate areas of the chart.   Review of Systems - negative All other ROS negative except what is listed above and in the HPI.      Objective:    BP 132/86 (BP Location: Left Arm)   Pulse 86   Temp 97.7 F (36.5 C) (Oral)   Ht 5\' 10"  (1.778 m)   Wt 228 lb (103.4 kg)   SpO2 99%   BMI 32.71 kg/m   Wt Readings from Last 3 Encounters:  05/26/19 228 lb (103.4 kg)  04/28/19 228 lb (103.4 kg)  11/26/18 222 lb (100.7 kg)    Physical Exam Vitals and nursing note reviewed.  Constitutional:      General: He is awake. He is not in acute distress.    Appearance: He is well-developed and well-groomed. He is not ill-appearing.  HENT:     Head: Normocephalic and atraumatic.     Right Ear: Hearing, tympanic membrane, ear canal and external ear normal. No drainage.     Left Ear: Hearing, tympanic membrane, ear canal and external ear normal. No drainage.     Nose: Nose normal.     Mouth/Throat:     Pharynx: Uvula midline.  Eyes:     General: Lids are normal.        Right eye: No discharge.        Left eye: No discharge.     Extraocular Movements: Extraocular movements intact.     Conjunctiva/sclera: Conjunctivae normal.     Pupils: Pupils are equal, round, and reactive to  light.     Visual Fields: Right eye visual fields normal and left eye visual fields normal.  Neck:     Thyroid: No thyromegaly.     Vascular: No carotid bruit or JVD.     Trachea: Trachea normal.  Cardiovascular:     Rate and Rhythm: Normal rate and regular rhythm.     Heart sounds: Normal heart sounds, S1 normal and S2 normal. No murmur. No gallop.   Pulmonary:     Effort: Pulmonary effort is normal. No accessory muscle usage or respiratory distress.     Breath sounds: Normal breath sounds.  Abdominal:     General: Bowel sounds are normal.     Palpations: Abdomen is soft. There is no hepatomegaly or splenomegaly.     Tenderness: There is no abdominal tenderness.  Musculoskeletal:        General: Normal range of motion.     Cervical back: Normal range of motion and neck supple.     Right lower leg: No edema.     Left lower leg: No edema.  Lymphadenopathy:     Head:     Right side of head: No submental, submandibular, tonsillar, preauricular or posterior auricular adenopathy.     Left side of head: No submental, submandibular, tonsillar, preauricular or posterior auricular adenopathy.     Cervical: No cervical adenopathy.  Skin:    General: Skin is warm and dry.     Capillary Refill: Capillary refill takes less than 2 seconds.     Findings: No rash.  Neurological:     Mental Status: He is alert and oriented to person, place, and time.     Cranial Nerves: Cranial nerves are intact.  Gait: Gait is intact.     Deep Tendon Reflexes: Reflexes are normal and symmetric.     Reflex Scores:      Brachioradialis reflexes are 2+ on the right side and 2+ on the left side.      Patellar reflexes are 2+ on the right side and 2+ on the left side. Psychiatric:        Attention and Perception: Attention normal.        Mood and Affect: Mood normal.        Speech: Speech normal.        Behavior: Behavior normal. Behavior is cooperative.        Thought Content: Thought content normal.         Cognition and Memory: Cognition normal.        Judgment: Judgment normal.     Results for orders placed or performed in visit on 04/27/19  PSA  Result Value Ref Range   Prostate Specific Ag, Serum <0.1 0.0 - 4.0 ng/mL      Assessment & Plan:   Problem List Items Addressed This Visit      Cardiovascular and Mediastinum   Essential hypertension    Chronic, ongoing with initial BP elevated but repeat below goal.  Recommend he check BP at home at least three mornings a week and document (his wife is a retired Marine scientist).  Continue current medication regimen and adjust as needed.  CMP today.      Relevant Medications   bisoprolol-hydrochlorothiazide (ZIAC) 5-6.25 MG tablet   Other Relevant Orders   CBC with Differential   Comprehensive metabolic panel   TSH     Endocrine   IFG (impaired fasting glucose)    Last A1C 5.3%.  Check A1C today and recheck annually.  Initiate medication as needed.  Continue diet focus at this time.      Relevant Orders   Hemoglobin A1c     Other   Pure hypercholesterolemia    Chronic, no current medications.  Recent ASCVD 15.9%.  Have had at length discussions with him on statin use and at this time he wishes to continue diet focus and recheck lipid panel.  Return in 6 months.      Relevant Medications   bisoprolol-hydrochlorothiazide (ZIAC) 5-6.25 MG tablet   Other Relevant Orders   Comprehensive metabolic panel   Lipid panel   H/O prostate cancer    Followed by urology.  Will plan on checking PSA annually, next February 2022, and if elevation return to urology.      Class 1 obesity due to excess calories without serious comorbidity with body mass index (BMI) of 32.0 to 32.9 in adult    Recommend continued focus on healthy diet choices and regular physical activity (30 minutes 5 days a week).  Focus on small goals and set timelines to achieve.        Other Visit Diagnoses    Annual physical exam    -  Primary   annual labs to include TSH,  lipid, CBC, CMP   Colon cancer screening       Cologuard ordered   Relevant Orders   Cologuard      Discussed aspirin prophylaxis for myocardial infarction prevention and decision was that we recommended ASA, and patient refused  LABORATORY TESTING:  Health maintenance labs ordered today as discussed above.   The natural history of prostate cancer and ongoing controversy regarding screening and potential treatment outcomes of prostate cancer has been discussed with  the patient. The meaning of a false positive PSA and a false negative PSA has been discussed. He indicates understanding of the limitations of this screening test and wishes to proceed with screening PSA testing.  Recently checked by urology.   IMMUNIZATIONS:   - Tdap: Tetanus vaccination status reviewed: last tetanus booster within 10 years. - Influenza: Refused - Pneumovax: Not applicable - Prevnar: Not applicable - Zostavax vaccine: discussed with patient, he wishes to review  SCREENING: - Colonoscopy: Ordered Cologuard -- cologuard last 2017 at [facility] Discussed with patient purpose of the colonoscopy is to detect colon cancer at curable precancerous or early stages   - AAA Screening: Not applicable  -Hearing Test: Not applicable  -Spirometry: Not applicable   PATIENT COUNSELING:    Sexuality: Discussed sexually transmitted diseases, partner selection, use of condoms, avoidance of unintended pregnancy  and contraceptive alternatives.   Advised to avoid cigarette smoking.  I discussed with the patient that most people either abstain from alcohol or drink within safe limits (<=14/week and <=4 drinks/occasion for males, <=7/weeks and <= 3 drinks/occasion for females) and that the risk for alcohol disorders and other health effects rises proportionally with the number of drinks per week and how often a drinker exceeds daily limits.  Discussed cessation/primary prevention of drug use and availability of treatment for abuse.    Diet: Encouraged to adjust caloric intake to maintain  or achieve ideal body weight, to reduce intake of dietary saturated fat and total fat, to limit sodium intake by avoiding high sodium foods and not adding table salt, and to maintain adequate dietary potassium and calcium preferably from fresh fruits, vegetables, and low-fat dairy products.    stressed the importance of regular exercise  Injury prevention: Discussed safety belts, safety helmets, smoke detector, smoking near bedding or upholstery.   Dental health: Discussed importance of regular tooth brushing, flossing, and dental visits.   Follow up plan: NEXT PREVENTATIVE PHYSICAL DUE IN 1 YEAR. Return in about 6 months (around 11/26/2019) for HTN/HLD.

## 2019-05-26 NOTE — Patient Instructions (Addendum)
COVID VACCINE SPOTS: [email or web address]  The [place] Department is giving vaccines to those 65+ and Health Care Workers Teachers and [place] providers start 05/06/19, Essential workers start 3/10 and those with co-morbidities start 06/03/19 Call [phone] to schedule  If you are 65+ you can get a vaccine through [facility] by signing up for an appointment.  You can sign up by going to: [email or web address].  You can get more information by going to: [email or web address]  [employer] next door is giving the [employer]- you can call [phone] or stop by there to schedule.  Health Maintenance, Male Adopting a healthy lifestyle and getting preventive care are important in promoting health and wellness. Ask your health care provider about:  The right schedule for you to have regular tests and exams.  Things you can do on your own to prevent diseases and keep yourself healthy. What should I know about diet, weight, and exercise? Eat a healthy diet   Eat a diet that includes plenty of vegetables, fruits, low-fat dairy products, and lean protein.  Do not eat a lot of foods that are high in solid fats, added sugars, or sodium. Maintain a healthy weight Body mass index (BMI) is a measurement that can be used to identify possible weight problems. It estimates body fat based on height and weight. Your health care provider can help determine your BMI and help you achieve or maintain a healthy weight. Get regular exercise Get regular exercise. This is one of the most important things you can do for your health. Most adults should:  Exercise for at least 150 minutes each week. The exercise should increase your heart rate and make you sweat (moderate-intensity exercise).  Do strengthening exercises at least twice a week. This is in addition to the moderate-intensity exercise.  Spend less  time sitting. Even light physical activity can be beneficial. Watch cholesterol and blood lipids Have your blood tested for lipids and cholesterol at 63 years of age, then have this test every 5 years. You may need to have your cholesterol levels checked more often if:  Your lipid or cholesterol levels are high.  You are older than 63 years of age.  You are at high risk for heart disease. What should I know about cancer screening? Many types of cancers can be detected early and may often be prevented. Depending on your health history and family history, you may need to have cancer screening at various ages. This may include screening for:  Colorectal cancer.  Prostate cancer.  Skin cancer.  Lung cancer. What should I know about heart disease, diabetes, and high blood pressure? Blood pressure and heart disease  High blood pressure causes heart disease and increases the risk of stroke. This is more likely to develop in people who have high blood pressure readings, are of African descent, or are overweight.  Talk with your health care provider about your target blood pressure readings.  Have your blood pressure checked: ? Every 3-5 years if you are 19-59 years of age. ? Every year if you are 44 years old or older.  If you are between the ages of 81 and 11 and are a current or former smoker, ask your health care provider if you should have a one-time screening for abdominal aortic aneurysm (AAA). Diabetes Have regular diabetes screenings. This checks your fasting blood sugar level. Have the screening done:  Once every three years after age 12 if you are at a normal weight and have  a low risk for diabetes.  More often and at a younger age if you are overweight or have a high risk for diabetes. What should I know about preventing infection? Hepatitis B If you have a higher risk for hepatitis B, you should be screened for this virus. Talk with your health care provider to find out if  you are at risk for hepatitis B infection. Hepatitis C Blood testing is recommended for:  Everyone born from [birth year] through 1964.  Anyone with known risk factors for hepatitis C. Sexually transmitted infections (STIs)  You should be screened each year for STIs, including gonorrhea and chlamydia, if: ? You are sexually active and are younger than 63 years of age. ? You are older than 62 years of age and your health care provider tells you that you are at risk for this type of infection. ? Your sexual activity has changed since you were last screened, and you are at increased risk for chlamydia or gonorrhea. Ask your health care provider if you are at risk.  Ask your health care provider about whether you are at high risk for HIV. Your health care provider may recommend a prescription medicine to help prevent HIV infection. If you choose to take medicine to prevent HIV, you should first get tested for HIV. You should then be tested every 3 months for as long as you are taking the medicine. Follow these instructions at home: Lifestyle  Do not use any products that contain nicotine or tobacco, such as cigarettes, e-cigarettes, and chewing tobacco. If you need help quitting, ask your health care provider.  Do not use street drugs.  Do not share needles.  Ask your health care provider for help if you need support or information about quitting drugs. Alcohol use  Do not drink alcohol if your health care provider tells you not to drink.  If you drink alcohol: ? Limit how much you have to 0-2 drinks a day. ? Be aware of how much alcohol is in your drink. In the [place] one drink equals one 12 oz bottle of beer (355 mL), one 5 oz glass of wine (148 mL), or one 1 oz glass of hard liquor (44 mL). General instructions  Schedule regular health, dental, and eye exams.  Stay current with your vaccines.  Tell your health care provider if: ? You often feel depressed. ? You have ever been abused or  do not feel safe at home. Summary  Adopting a healthy lifestyle and getting preventive care are important in promoting health and wellness.  Follow your health care provider's instructions about healthy diet, exercising, and getting tested or screened for diseases.  Follow your health care provider's instructions on monitoring your cholesterol and blood pressure. This information is not intended to replace advice given to you by your health care provider. Make sure you discuss any questions you have with your health care provider. Document Revised: 02/19/2018 Document Reviewed: 02/19/2018 Elsevier Patient Education  2020 [facility]) Exercise Recommendation  Being physically active is important to prevent heart disease and stroke, the nation's No. 1and No. 5killers. To improve overall cardiovascular health, we suggest at least 150 minutes per week of moderate exercise or 75 minutes per week of vigorous exercise (or a combination of moderate and vigorous activity). Thirty minutes a day, five times a week is an easy goal to remember. You will also experience benefits even if you divide your time into two or three segments of  10 to 15 minutes per day.  For people who would benefit from lowering their blood pressure or cholesterol, we recommend 40 minutes of aerobic exercise of moderate to vigorous intensity three to four times a week to lower the risk for heart attack and stroke.  Physical activity is anything that makes you move your body and burn calories.  This includes things like climbing stairs or playing sports. Aerobic exercises benefit your heart, and include walking, jogging, swimming or biking. Strength and stretching exercises are best for overall stamina and flexibility.  The simplest, positive change you can make to effectively improve your heart health is to start walking. It's enjoyable, free, easy, social and great exercise. A walking program is  flexible and boasts high success rates because people can stick with it. It's easy for walking to become a regular and satisfying part of life.   For Overall Cardiovascular Health:  At least 30 minutes of moderate-intensity aerobic activity at least 5 days per week for a total of 150  OR   At least 25 minutes of vigorous aerobic activity at least 3 days per week for a total of 75 minutes; or a combination of moderate- and vigorous-intensity aerobic activity  AND   Moderate- to high-intensity muscle-strengthening activity at least 2 days per week for additional health benefits.  For Lowering Blood Pressure and Cholesterol  An average 40 minutes of moderate- to vigorous-intensity aerobic activity 3 or 4 times per week  What if I can't make it to the time goal? Something is always better than nothing! And everyone has to start somewhere. Even if you've been sedentary for years, today is the day you can begin to make healthy changes in your life. If you don't think you'll make it for 30 or 40 minutes, set a reachable goal for today. You can work up toward your overall goal by increasing your time as you get stronger. Don't let all-or-nothing thinking rob you of doing what you can every day.  Source:http://www.heart.org

## 2019-05-26 NOTE — Assessment & Plan Note (Signed)
Last A1C 5.3%.  Check A1C today and recheck annually.  Initiate medication as needed.  Continue diet focus at this time.

## 2019-05-26 NOTE — Assessment & Plan Note (Signed)
Followed by urology.  Will plan on checking PSA annually, next February 2022, and if elevation return to urology.

## 2019-05-26 NOTE — Assessment & Plan Note (Signed)
Chronic, ongoing with initial BP elevated but repeat below goal.  Recommend he check BP at home at least three mornings a week and document (his wife is a retired Marine scientist).  Continue current medication regimen and adjust as needed.  CMP today.

## 2019-05-26 NOTE — Assessment & Plan Note (Signed)
Chronic, no current medications.  Recent ASCVD 15.9%.  Have had at length discussions with him on statin use and at this time he wishes to continue diet focus and recheck lipid panel.  Return in 6 months.

## 2019-05-27 ENCOUNTER — Other Ambulatory Visit: Payer: Self-pay | Admitting: Nurse Practitioner

## 2019-05-27 ENCOUNTER — Encounter: Payer: Self-pay | Admitting: Nurse Practitioner

## 2019-05-27 DIAGNOSIS — R7989 Other specified abnormal findings of blood chemistry: Secondary | ICD-10-CM

## 2019-05-27 LAB — COMPREHENSIVE METABOLIC PANEL
ALT: 29 IU/L (ref 0–44)
AST: 22 IU/L (ref 0–40)
Albumin/Globulin Ratio: 2 (ref 1.2–2.2)
Albumin: 4.8 g/dL (ref 3.8–4.8)
Alkaline Phosphatase: 53 IU/L (ref 39–117)
BUN/Creatinine Ratio: 13 (ref 10–24)
BUN: 13 mg/dL (ref 8–27)
Bilirubin Total: 0.4 mg/dL (ref 0.0–1.2)
CO2: 23 mmol/L (ref 20–29)
Calcium: 9.5 mg/dL (ref 8.6–10.2)
Chloride: 101 mmol/L (ref 96–106)
Creatinine, Ser: 1.02 mg/dL (ref 0.76–1.27)
GFR calc Af Amer: 90 mL/min/{1.73_m2} (ref >59)
GFR calc non Af Amer: 78 mL/min/{1.73_m2} (ref >59)
Globulin, Total: 2.4 g/dL (ref 1.5–4.5)
Glucose: 105 mg/dL — ABNORMAL HIGH (ref 65–99)
Potassium: 4.9 mmol/L (ref 3.5–5.2)
Sodium: 139 mmol/L (ref 134–144)
Total Protein: 7.2 g/dL (ref 6.0–8.5)

## 2019-05-27 LAB — HEMOGLOBIN A1C
Est. average glucose Bld gHb Est-mCnc: 111 mg/dL
Hgb A1c MFr Bld: 5.5 % (ref 4.8–5.6)

## 2019-05-27 LAB — LIPID PANEL
Chol/HDL Ratio: 3.6 ratio (ref 0.0–5.0)
Cholesterol, Total: 273 mg/dL — ABNORMAL HIGH (ref 100–199)
HDL: 75 mg/dL (ref >39)
LDL Chol Calc (NIH): 181 mg/dL — ABNORMAL HIGH (ref 0–99)
Triglycerides: 101 mg/dL (ref 0–149)
VLDL Cholesterol Cal: 17 mg/dL (ref 5–40)

## 2019-05-27 LAB — CBC WITH DIFFERENTIAL/PLATELET
Basophils Absolute: 0 10*3/uL (ref 0.0–0.2)
Basos: 1 %
EOS (ABSOLUTE): 0.3 10*3/uL (ref 0.0–0.4)
Eos: 6 %
Hematocrit: 45 % (ref 37.5–51.0)
Hemoglobin: 15.7 g/dL (ref 13.0–17.7)
Immature Grans (Abs): 0 10*3/uL (ref 0.0–0.1)
Immature Granulocytes: 1 %
Lymphocytes Absolute: 0.7 10*3/uL (ref 0.7–3.1)
Lymphs: 17 %
MCH: 30.5 pg (ref 26.6–33.0)
MCHC: 34.9 g/dL (ref 31.5–35.7)
MCV: 88 fL (ref 79–97)
Monocytes Absolute: 0.4 10*3/uL (ref 0.1–0.9)
Monocytes: 9 %
Neutrophils Absolute: 2.7 10*3/uL (ref 1.4–7.0)
Neutrophils: 66 %
Platelets: 182 10*3/uL (ref 150–450)
RBC: 5.14 x10E6/uL (ref 4.14–5.80)
RDW: 12.8 % (ref 11.6–15.4)
WBC: 4.1 10*3/uL (ref 3.4–10.8)

## 2019-05-27 LAB — TSH: TSH: 4.53 u[IU]/mL — ABNORMAL HIGH (ref 0.450–4.500)

## 2019-05-27 NOTE — Progress Notes (Signed)
Contacted via MyChart  The 10-year ASCVD risk score Mikey Bussing DC Jr., et al., 2013) is: 13.2%   Values used to calculate the score:     Age: 64 years     Sex: Male     Is Non-Hispanic African American: No     Diabetic: No     Tobacco smoker: No     Systolic Blood Pressure: [id number] mmHg     Is BP treated: Yes     HDL Cholesterol: 75 mg/dL     Total Cholesterol: 273 mg/dL

## 2019-12-01 ENCOUNTER — Ambulatory Visit: Payer: 59 | Admitting: Nurse Practitioner

## 2021-05-19 DIAGNOSIS — Z8546 Personal history of malignant neoplasm of prostate: Secondary | ICD-10-CM | POA: Diagnosis not present

## 2021-05-19 DIAGNOSIS — Z1211 Encounter for screening for malignant neoplasm of colon: Secondary | ICD-10-CM | POA: Diagnosis not present

## 2021-05-19 DIAGNOSIS — Z125 Encounter for screening for malignant neoplasm of prostate: Secondary | ICD-10-CM | POA: Diagnosis not present

## 2021-05-19 DIAGNOSIS — I10 Essential (primary) hypertension: Secondary | ICD-10-CM | POA: Diagnosis not present

## 2021-05-19 DIAGNOSIS — Z79899 Other long term (current) drug therapy: Secondary | ICD-10-CM | POA: Diagnosis not present

## 2021-05-19 DIAGNOSIS — E7800 Pure hypercholesterolemia, unspecified: Secondary | ICD-10-CM | POA: Diagnosis not present

## 2021-06-05 ENCOUNTER — Other Ambulatory Visit: Payer: Self-pay

## 2021-06-05 ENCOUNTER — Ambulatory Visit: Payer: Medicare HMO | Admitting: Dermatology

## 2021-06-05 DIAGNOSIS — Z1283 Encounter for screening for malignant neoplasm of skin: Secondary | ICD-10-CM | POA: Diagnosis not present

## 2021-06-05 DIAGNOSIS — L570 Actinic keratosis: Secondary | ICD-10-CM

## 2021-06-05 DIAGNOSIS — D1800 Hemangioma unspecified site: Secondary | ICD-10-CM | POA: Diagnosis not present

## 2021-06-05 DIAGNOSIS — L578 Other skin changes due to chronic exposure to nonionizing radiation: Secondary | ICD-10-CM

## 2021-06-05 DIAGNOSIS — C4491 Basal cell carcinoma of skin, unspecified: Secondary | ICD-10-CM

## 2021-06-05 DIAGNOSIS — D489 Neoplasm of uncertain behavior, unspecified: Secondary | ICD-10-CM

## 2021-06-05 DIAGNOSIS — D229 Melanocytic nevi, unspecified: Secondary | ICD-10-CM

## 2021-06-05 DIAGNOSIS — L814 Other melanin hyperpigmentation: Secondary | ICD-10-CM | POA: Diagnosis not present

## 2021-06-05 DIAGNOSIS — L821 Other seborrheic keratosis: Secondary | ICD-10-CM

## 2021-06-05 DIAGNOSIS — Z872 Personal history of diseases of the skin and subcutaneous tissue: Secondary | ICD-10-CM

## 2021-06-05 DIAGNOSIS — C44311 Basal cell carcinoma of skin of nose: Secondary | ICD-10-CM

## 2021-06-05 HISTORY — DX: Basal cell carcinoma of skin, unspecified: C44.91

## 2021-06-05 NOTE — Patient Instructions (Addendum)
?Biopsy Wound Care Instructions ? ?Leave the original bandage on for 24 hours if possible.  If the bandage becomes soaked or soiled before that time, it is OK to remove it and examine the wound.  A small amount of post-operative bleeding is normal.  If excessive bleeding occurs, remove the bandage, place gauze over the site and apply continuous pressure (no peeking) over the area for 30 minutes. If this does not work, please call our clinic as soon as possible or page your doctor if it is after hours.  ? ?Once a day, cleanse the wound with soap and water. It is fine to shower. If a thick crust develops you may use a Q-tip dipped into dilute hydrogen peroxide (mix 1:1 with water) to dissolve it.  Hydrogen peroxide can slow the healing process, so use it only as needed.   ? ?After washing, apply petroleum jelly (Vaseline) or an antibiotic ointment if your doctor prescribed one for you, followed by a bandage.   ? ?For best healing, the wound should be covered with a layer of ointment at all times. If you are not able to keep the area covered with a bandage to hold the ointment in place, this may mean re-applying the ointment several times a day.  Continue this wound care until the wound has healed and is no longer open.  ? ?Itching and mild discomfort is normal during the healing process. However, if you develop pain or severe itching, please call our office.  ? ?If you have any discomfort, you can take Tylenol (acetaminophen) or ibuprofen as directed on the bottle. (Please do not take these if you have an allergy to them or cannot take them for another reason). ? ?Some redness, tenderness and white or yellow material in the wound is normal healing.  If the area becomes very sore and red, or develops a thick yellow-green material (pus), it may be infected; please notify us.   ? ?If you have stitches, return to clinic as directed to have the stitches removed. You will continue wound care for 2-3 days after the stitches  are removed.  ? ?Wound healing continues for up to one year following surgery. It is not unusual to experience pain in the scar from time to time during the interval.  If the pain becomes severe or the scar thickens, you should notify the office.   ? ?A slight amount of redness in a scar is expected for the first six months.  After six months, the redness will fade and the scar will soften and fade.  The color difference becomes less noticeable with time.  If there are any problems, return for a post-op surgery check at your earliest convenience. ? ?To improve the appearance of the scar, you can use silicone scar gel, cream, or sheets (such as Mederma or Serica) every night for up to one year. These are available over the counter (without a prescription). ? ?Please call our office at [phone] for any questions or concerns. ? ? ?Actinic keratoses are precancerous spots that appear secondary to cumulative UV radiation exposure/sun exposure over time. They are chronic with expected duration over 1 year. A portion of actinic keratoses will progress to squamous cell carcinoma of the skin. It is not possible to reliably predict which spots will progress to skin cancer and so treatment is recommended to prevent development of skin cancer. ? ?Recommend daily broad spectrum sunscreen SPF 30+ to sun-exposed areas, reapply every 2 hours as needed.  ?Recommend  staying in the shade or wearing long sleeves, sun glasses (UVA+UVB protection) and wide brim hats (4-inch brim around the entire circumference of the hat). ?Call for new or changing lesions.  ? ?Cryotherapy Aftercare ? ?Wash gently with soap and water everyday.   ?Apply Vaseline and Band-Aid daily until healed.  ? ? ? ? ? ?Seborrheic Keratosis ? ?What causes seborrheic keratoses? ?Seborrheic keratoses are harmless, common skin growths that first appear during adult life.  As time goes by, more growths appear.  Some people may develop a large number of them.   Seborrheic keratoses appear on both covered and uncovered body parts.  They are not caused by sunlight.  The tendency to develop seborrheic keratoses can be inherited.  They vary in color from skin-colored to gray, brown, or even black.  They can be either smooth or have a rough, warty surface.   ?Seborrheic keratoses are superficial and look as if they were stuck on the skin.  Under the microscope this type of keratosis looks like layers upon layers of skin.  That is why at times the top layer may seem to fall off, but the rest of the growth remains and re-grows.   ? ?Treatment ?Seborrheic keratoses do not need to be treated, but can easily be removed in the office.  Seborrheic keratoses often cause symptoms when they rub on clothing or jewelry.  Lesions can be in the way of shaving.  If they become inflamed, they can cause itching, soreness, or burning.  Removal of a seborrheic keratosis can be accomplished by freezing, burning, or surgery. ?If any spot bleeds, scabs, or grows rapidly, please return to have it checked, as these can be an indication of a skin cancer. ? ? ? ?Melanoma ABCDEs ? ?Melanoma is the most dangerous type of skin cancer, and is the leading cause of death from skin disease.  You are more likely to develop melanoma if you: ?Have light-colored skin, light-colored eyes, or red or blond hair ?Spend a lot of time in the sun ?Tan regularly, either outdoors or in a tanning bed ?Have had blistering sunburns, especially during childhood ?Have a close family member who has had a melanoma ?Have atypical moles or large birthmarks ? ?Early detection of melanoma is key since treatment is typically straightforward and cure rates are extremely high if we catch it early.  ? ?The first sign of melanoma is often a change in a mole or a new dark spot.  The ABCDE system is a way of remembering the signs of melanoma. ? ?A for asymmetry:  The two halves do not match. ?B for border:  The edges of the growth are  irregular. ?C for color:  A mixture of colors are present instead of an even brown color. ?D for diameter:  Melanomas are usually (but not always) greater than 60m - the size of a pencil eraser. ?E for evolution:  The spot keeps changing in size, shape, and color. ? ?Please check your skin once per month between visits. You can use a small mirror in front and a large mirror behind you to keep an eye on the back side or your body.  ? ?If you see any new or changing lesions before your next follow-up, please call to schedule a visit. ? ?Please continue daily skin protection including broad spectrum sunscreen SPF 30+ to sun-exposed areas, reapplying every 2 hours as needed when you're outdoors.   ? ? ? ? ? ? ?If You Need Anything After  Your Visit ? ?If you have any questions or concerns for your doctor, please call our main line at [phone] and press option 4 to reach your doctor's medical assistant. If no one answers, please leave a voicemail as directed and we will return your call as soon as possible. Messages left after 4 pm will be answered the following business day.  ? ?You may also send [place] a message via MyChart. We typically respond to MyChart messages within 1-2 business days. ? ?For prescription refills, please ask your pharmacy to contact our office. Our fax number is [phone]. ? ?If you have an urgent issue when the clinic is closed that cannot wait until the next business day, you can page your doctor at the number below.   ? ?Please note that while we do our best to be available for urgent issues outside of office hours, we are not available 24/7.  ? ?If you have an urgent issue and are unable to reach [place], you may choose to seek medical care at your doctor's office, retail clinic, urgent care center, or emergency room. ? ?If you have a medical emergency, please immediately call 911 or go to the emergency department. ? ?Pager Numbers ? ?- Dr. Nehemiah Massed: [phone] ? ?- Dr. Laurence Ferrari: [phone] ? ?-  Dr. Nicole Kindred: [phone] ? ?In the event of inclement weather, please call our main line at [phone] for an update on the status of any delays or closures. ? ?Dermatology Medication Tips: ?Please keep the bo

## 2021-06-05 NOTE — Progress Notes (Signed)
? ?New Patient Visit ? ?Subjective  ?Brad Patrick is a 66 y.o. male who presents for the following: New Patient (Initial Visit) (Patient here today concerning some dark spots at back that his wife noticed. Patient also is concerned with nose. Patient reports history of precancer.  Patient denies history of skin cancer or family history of skin cancer. ). ?The patient presents for Total-Body Skin Exam (TBSE) for skin cancer screening and mole check.  The patient has spots, moles and lesions to be evaluated, some may be new or changing and the patient has concerns that these could be cancer. ? ?The following portions of the chart were reviewed this encounter and updated as appropriate:  ? Tobacco  Allergies  Meds  Problems  Med Hx  Surg Hx  Fam Hx   ?  ?Review of Systems:  No other skin or systemic complaints except as noted in HPI or Assessment and Plan. ? ?Objective  ?Well appearing patient in no apparent distress; mood and affect are within normal limits. ? ?A full examination was performed including scalp, head, eyes, ears, nose, lips, neck, chest, axillae, abdomen, back, buttocks, bilateral upper extremities, bilateral lower extremities, hands, feet, fingers, toes, fingernails, and toenails. All findings within normal limits unless otherwise noted below. ? ?right face x 3 (3) ?Erythematous thin papules/macules with gritty scale.  ? ?right nasal bridge ? 0.8 cm crusted papule  ? ? ? ? ? ? ? ?Assessment & Plan  ?Actinic keratosis (3) ?right face x 3 ?Actinic keratoses are precancerous spots that appear secondary to cumulative UV radiation exposure/sun exposure over time. They are chronic with expected duration over 1 year. A portion of actinic keratoses will progress to squamous cell carcinoma of the skin. It is not possible to reliably predict which spots will progress to skin cancer and so treatment is recommended to prevent development of skin cancer. ? ?Recommend daily broad spectrum sunscreen SPF 30+  to sun-exposed areas, reapply every 2 hours as needed.  ?Recommend staying in the shade or wearing long sleeves, sun glasses (UVA+UVB protection) and wide brim hats (4-inch brim around the entire circumference of the hat). ?Call for new or changing lesions. ? ?Destruction of lesion - right face x 3 ?Complexity: simple   ?Destruction method: cryotherapy   ?Informed consent: discussed and consent obtained   ?Timeout:  patient name, date of birth, surgical site, and procedure verified ?Lesion destroyed using liquid nitrogen: Yes   ?Region frozen until ice ball extended beyond lesion: Yes   ?Outcome: patient tolerated procedure well with no complications   ?Post-procedure details: wound care instructions given   ?Additional details:  Prior to procedure, discussed risks of blister formation, small wound, skin dyspigmentation, or rare scar following cryotherapy. Recommend Vaseline ointment to treated areas while healing. ? ?Neoplasm of uncertain behavior ?right nasal bridge ?Skin / nail biopsy ?Type of biopsy: tangential   ?Informed consent: discussed and consent obtained   ?Timeout: patient name, date of birth, surgical site, and procedure verified   ?Procedure prep:  Patient was prepped and draped in usual sterile fashion ?Prep type:  Isopropyl alcohol ?Anesthesia: the lesion was anesthetized in a standard fashion   ?Anesthetic:  1% lidocaine w/ epinephrine 1-100,000 buffered w/ 8.4% NaHCO3 ?Instrument used: flexible razor blade   ?Hemostasis achieved with: pressure, aluminum chloride and electrodesiccation   ?Outcome: patient tolerated procedure well   ?Post-procedure details: sterile dressing applied and wound care instructions given   ?Dressing type: petrolatum and bandage   ? ?Specimen 1 -  Surgical pathology ?Differential Diagnosis: r/o bcc  ?Check Margins: No ?R/o bcc  ? ?Skin cancer screening ? ?Seborrheic Keratoses ?- Stuck-on, waxy, tan-brown papules and/or plaques  ?- Benign-appearing ?- Discussed benign  etiology and prognosis. ?- Observe ?- Call for any changes ? ?Melanocytic Nevi ?- Tan-brown and/or pink-flesh-colored symmetric macules and papules ?- Benign appearing on exam today ?- Observation ?- Call clinic for new or changing moles ?- Recommend daily use of broad spectrum spf 30+ sunscreen to sun-exposed areas.  ? ?Hemangiomas ?- Red papules ?- Discussed benign nature ?- Observe ?- Call for any changes ? ?Lentigines ?- Scattered tan macules ?- Due to sun exposure ?- Benign-appering, observe ?- Recommend daily broad spectrum sunscreen SPF 30+ to sun-exposed areas, reapply every 2 hours as needed. ?- Call for any changes ? ?Actinic Damage ?- chronic, secondary to cumulative UV radiation exposure/sun exposure over time ?- diffuse scaly erythematous macules with underlying dyspigmentation ?- Recommend daily broad spectrum sunscreen SPF 30+ to sun-exposed areas, reapply every 2 hours as needed.  ?- Recommend staying in the shade or wearing long sleeves, sun glasses (UVA+UVB protection) and wide brim hats (4-inch brim around the entire circumference of the hat). ?- Call for new or changing lesions. ? ?Return for 6 month follow up. ? ?I, Ruthell Rummage, CMA, am acting as scribe for Sarina Ser, MD. ?Documentation: I have reviewed the above documentation for accuracy and completeness, and I agree with the above. ? ?Sarina Ser, MD ? ?

## 2021-06-06 ENCOUNTER — Encounter: Payer: Self-pay | Admitting: Dermatology

## 2021-06-07 ENCOUNTER — Telehealth: Payer: Self-pay

## 2021-06-07 NOTE — Telephone Encounter (Signed)
Patient saw biopsy results on MyChart and left message asking for a call back. Patient was advised bx showed BCC and Mohs is recommended. I sent message via MyChart with the names of surgeons we refer to. He will let us know if he would like to come in and talk to Dr. Raliegh Ip first or if we should go ahead and schedule him for Mohs. ?Lurlean Horns., RMA ?

## 2021-06-20 ENCOUNTER — Ambulatory Visit (INDEPENDENT_AMBULATORY_CARE_PROVIDER_SITE_OTHER): Payer: Medicare HMO | Admitting: Dermatology

## 2021-06-20 DIAGNOSIS — C44311 Basal cell carcinoma of skin of nose: Secondary | ICD-10-CM

## 2021-06-20 DIAGNOSIS — L578 Other skin changes due to chronic exposure to nonionizing radiation: Secondary | ICD-10-CM

## 2021-06-20 NOTE — Patient Instructions (Signed)

## 2021-06-20 NOTE — Progress Notes (Signed)
? ?  Follow-Up Visit ?  ?Subjective  ?Brad Patrick is a 66 y.o. male who presents for the following: Biopsy proven BCC (Patient is here today to discuss pathology results and treatment options. ). ? ?The following portions of the chart were reviewed this encounter and updated as appropriate:  ? Tobacco  Allergies  Meds  Problems  Med Hx  Surg Hx  Fam Hx   ?  ?Review of Systems:  No other skin or systemic complaints except as noted in HPI or Assessment and Plan. ? ?Objective  ?Well appearing patient in no apparent distress; mood and affect are within normal limits. ? ?A focused examination was performed including the face. Relevant physical exam findings are noted in the Assessment and Plan. ? ?R nasal bridge ?Healing biopsy site.  ? ? ?Assessment & Plan  ?Basal cell carcinoma (BCC) of skin of nose ?Biopsy-proven ?R nasal bridge ?Discussed pathology in detail and treatment options (ED&C vs Mohs vs radiation). Recommend Mohs procedure with Dr. Eli Phillips.   ? ?Related Procedures ?Ambulatory referral to Dermatology ? ?Actinic Damage ?- chronic, secondary to cumulative UV radiation exposure/sun exposure over time ?- diffuse scaly erythematous macules with underlying dyspigmentation ?- Recommend daily broad spectrum sunscreen SPF 30+ to sun-exposed areas, reapply every 2 hours as needed.  ?- Recommend staying in the shade or wearing long sleeves, sun glasses (UVA+UVB protection) and wide brim hats (4-inch brim around the entire circumference of the hat). ?- Call for new or changing lesions. ? ?Return for appointment as scheduled. ? ?I, Rudell Cobb, CMA, am acting as scribe for Sarina Ser, MD . ?Documentation: I have reviewed the above documentation for accuracy and completeness, and I agree with the above. ? ?Sarina Ser, MD ? ?

## 2021-06-27 ENCOUNTER — Encounter: Payer: Self-pay | Admitting: Dermatology

## 2021-07-04 ENCOUNTER — Telehealth: Payer: Self-pay

## 2021-07-04 NOTE — Telephone Encounter (Signed)
Pt's Mohs referral appt schedule with Dr Lacinda Axon August 18, 2021  ?

## 2021-07-23 DIAGNOSIS — Z1211 Encounter for screening for malignant neoplasm of colon: Secondary | ICD-10-CM | POA: Diagnosis not present

## 2021-08-16 DIAGNOSIS — C44311 Basal cell carcinoma of skin of nose: Secondary | ICD-10-CM | POA: Diagnosis not present

## 2021-10-17 DIAGNOSIS — Z1211 Encounter for screening for malignant neoplasm of colon: Secondary | ICD-10-CM | POA: Diagnosis not present

## 2021-10-17 DIAGNOSIS — K641 Second degree hemorrhoids: Secondary | ICD-10-CM | POA: Diagnosis not present

## 2021-10-17 DIAGNOSIS — D124 Benign neoplasm of descending colon: Secondary | ICD-10-CM | POA: Diagnosis not present

## 2021-10-17 DIAGNOSIS — D125 Benign neoplasm of sigmoid colon: Secondary | ICD-10-CM | POA: Diagnosis not present

## 2021-10-17 DIAGNOSIS — R195 Other fecal abnormalities: Secondary | ICD-10-CM | POA: Diagnosis not present

## 2021-10-17 DIAGNOSIS — D122 Benign neoplasm of ascending colon: Secondary | ICD-10-CM | POA: Diagnosis not present

## 2021-11-22 DIAGNOSIS — I10 Essential (primary) hypertension: Secondary | ICD-10-CM | POA: Diagnosis not present

## 2021-11-22 DIAGNOSIS — Z79899 Other long term (current) drug therapy: Secondary | ICD-10-CM | POA: Diagnosis not present

## 2021-11-22 DIAGNOSIS — Z0000 Encounter for general adult medical examination without abnormal findings: Secondary | ICD-10-CM | POA: Diagnosis not present

## 2021-11-22 DIAGNOSIS — E785 Hyperlipidemia, unspecified: Secondary | ICD-10-CM | POA: Diagnosis not present

## 2021-11-22 DIAGNOSIS — Z125 Encounter for screening for malignant neoplasm of prostate: Secondary | ICD-10-CM | POA: Diagnosis not present

## 2021-11-22 DIAGNOSIS — Z8546 Personal history of malignant neoplasm of prostate: Secondary | ICD-10-CM | POA: Diagnosis not present

## 2021-12-06 ENCOUNTER — Ambulatory Visit (INDEPENDENT_AMBULATORY_CARE_PROVIDER_SITE_OTHER): Payer: Medicare HMO | Admitting: Dermatology

## 2021-12-06 ENCOUNTER — Encounter: Payer: Self-pay | Admitting: Dermatology

## 2021-12-06 DIAGNOSIS — L219 Seborrheic dermatitis, unspecified: Secondary | ICD-10-CM

## 2021-12-06 DIAGNOSIS — L814 Other melanin hyperpigmentation: Secondary | ICD-10-CM | POA: Diagnosis not present

## 2021-12-06 DIAGNOSIS — Z85828 Personal history of other malignant neoplasm of skin: Secondary | ICD-10-CM

## 2021-12-06 DIAGNOSIS — C44612 Basal cell carcinoma of skin of right upper limb, including shoulder: Secondary | ICD-10-CM | POA: Diagnosis not present

## 2021-12-06 DIAGNOSIS — Z1283 Encounter for screening for malignant neoplasm of skin: Secondary | ICD-10-CM

## 2021-12-06 DIAGNOSIS — D1801 Hemangioma of skin and subcutaneous tissue: Secondary | ICD-10-CM

## 2021-12-06 DIAGNOSIS — D492 Neoplasm of unspecified behavior of bone, soft tissue, and skin: Secondary | ICD-10-CM

## 2021-12-06 DIAGNOSIS — L570 Actinic keratosis: Secondary | ICD-10-CM

## 2021-12-06 DIAGNOSIS — L578 Other skin changes due to chronic exposure to nonionizing radiation: Secondary | ICD-10-CM | POA: Diagnosis not present

## 2021-12-06 DIAGNOSIS — C4491 Basal cell carcinoma of skin, unspecified: Secondary | ICD-10-CM

## 2021-12-06 DIAGNOSIS — D229 Melanocytic nevi, unspecified: Secondary | ICD-10-CM

## 2021-12-06 DIAGNOSIS — L821 Other seborrheic keratosis: Secondary | ICD-10-CM

## 2021-12-06 DIAGNOSIS — D225 Melanocytic nevi of trunk: Secondary | ICD-10-CM

## 2021-12-06 DIAGNOSIS — D2239 Melanocytic nevi of other parts of face: Secondary | ICD-10-CM

## 2021-12-06 HISTORY — DX: Basal cell carcinoma of skin, unspecified: C44.91

## 2021-12-06 NOTE — Patient Instructions (Addendum)
Cryotherapy Aftercare  Wash gently with soap and water everyday.   Apply Vaseline and Band-Aid daily until healed.   Due to recent changes in healthcare laws, you may see results of your pathology and/or laboratory studies on MyChart before the doctors have had a chance to review them. We understand that in some cases there may be results that are confusing or concerning to you. Please understand that not all results are received at the same time and often the doctors may need to interpret multiple results in order to provide you with the best plan of care or course of treatment. Therefore, we ask that you please give [place] 2 business days to thoroughly review all your results before contacting the office for clarification. Should we see a critical lab result, you will be contacted sooner.  Wound Care Instructions  Cleanse wound gently with soap and water once a day then pat dry with clean gauze. Apply a thin coat of Petrolatum (petroleum jelly, "Vaseline") over the wound (unless you have an allergy to this). We recommend that you use a new, sterile tube of Vaseline. Do not pick or remove scabs. Do not remove the yellow or white "healing tissue" from the base of the wound.  Cover the wound with fresh, clean, nonstick gauze and secure with paper tape. You may use Band-Aids in place of gauze and tape if the wound is small enough, but would recommend trimming much of the tape off as there is often too much. Sometimes Band-Aids can irritate the skin.  You should call the office for your biopsy report after 1 week if you have not already been contacted.  If you experience any problems, such as abnormal amounts of bleeding, swelling, significant bruising, significant pain, or evidence of infection, please call the office immediately.  FOR ADULT SURGERY PATIENTS: If you need something for pain relief you may take 1 extra strength Tylenol (acetaminophen) AND 2 Ibuprofen ('200mg'$  each) together every 4 hours as  needed for pain. (do not take these if you are allergic to them or if you have a reason you should not take them.) Typically, you may only need pain medication for 1 to 3 days.      If You Need Anything After Your Visit  If you have any questions or concerns for your doctor, please call our main line at [phone] and press option 4 to reach your doctor's medical assistant. If no one answers, please leave a voicemail as directed and we will return your call as soon as possible. Messages left after 4 pm will be answered the following business day.   You may also send [place] a message via [place]. We typically respond to MyChart messages within 1-2 business days.  For prescription refills, please ask your pharmacy to contact our office. Our fax number is [phone].  If you have an urgent issue when the clinic is closed that cannot wait until the next business day, you can page your doctor at the number below.    Please note that while we do our best to be available for urgent issues outside of office hours, we are not available 24/7.   If you have an urgent issue and are unable to reach [place], you may choose to seek medical care at your doctor's office, retail clinic, urgent care center, or emergency room.  If you have a medical emergency, please immediately call 911 or go to the emergency department.  Pager Numbers  - Dr. Nehemiah Massed: [phone]  -  [phone]  - Dr. Moye: [phone]  - Dr. Stewart: [phone]  In the event of inclement weather, please call our main line at [phone] for an update on the status of any delays or closures.  Dermatology Medication Tips: Please keep the boxes that topical medications come in in order to help keep track of the instructions about where and how to use these. Pharmacies typically print the medication instructions only on the boxes and not directly on the medication tubes.   If your medication is too expensive, please contact our office at  [phone] option 4 or send us a message through MyChart.   We are unable to tell what your co-pay for medications will be in advance as this is different depending on your insurance coverage. However, we may be able to find a substitute medication at lower cost or fill out paperwork to get insurance to cover a needed medication.   If a prior authorization is required to get your medication covered by your insurance company, please allow us 1-2 business days to complete this process.  Drug prices often vary depending on where the prescription is filled and some pharmacies may offer cheaper prices.  The website www.goodrx.com contains coupons for medications through different pharmacies. The prices here do not account for what the cost may be with help from insurance (it may be cheaper with your insurance), but the website can give you the price if you did not use any insurance.  - You can print the associated coupon and take it with your prescription to the pharmacy.  - You may also stop by our office during regular business hours and pick up a GoodRx coupon card.  - If you need your prescription sent electronically to a different pharmacy, notify our office through [facility] MyChart or by phone at [phone] option 4.     Si Usted Necesita Algo Despus de Su Visita  Tambin puede enviarnos un mensaje a travs de MyChart. Por lo general respondemos a los mensajes de MyChart en el transcurso de 1 a 2 das hbiles.  Para renovar recetas, por favor pida a su farmacia que se ponga en contacto con nuestra oficina. Nuestro nmero de fax es el [phone].  Si tiene un asunto urgente cuando la clnica est cerrada y que no puede esperar hasta el siguiente da hbil, puede llamar/localizar a su doctor(a) al nmero que aparece a continuacin.   Por favor, tenga en cuenta que aunque hacemos todo lo posible para estar disponibles para asuntos urgentes fuera del horario de oficina, no estamos  disponibles las 24 horas del da, los 7 das de la semana.   Si tiene un problema urgente y no puede comunicarse con nosotros, puede optar por buscar atencin mdica  en el consultorio de su doctor(a), en una clnica privada, en un centro de atencin urgente o en una sala de emergencias.  Si tiene una emergencia mdica, por favor llame inmediatamente al 911 o vaya a la sala de emergencias.  Nmeros de bper  - Dr. Kowalski: [phone]  - Dra. Moye: [phone]  - Dra. Stewart: [phone]  En caso de inclemencias del tiempo, por favor llame a nuestra lnea principal al [phone] para una actualizacin sobre el estado de cualquier retraso o cierre.  Consejos para la medicacin en dermatologa: Por favor, guarde las cajas en las que vienen los medicamentos de uso tpico para ayudarle a seguir las instrucciones sobre dnde y cmo usarlos. Las farmacias generalmente imprimen las instrucciones del medicamento slo en   las cajas y no directamente en los tubos del medicamento.   Si su medicamento es muy caro, por favor, pngase en contacto con nuestra oficina llamando al [phone] y presione la opcin 4 o envenos un mensaje a travs de MyChart.   No podemos decirle cul ser su copago por los medicamentos por adelantado ya que esto es diferente dependiendo de la cobertura de su seguro. Sin embargo, es posible que podamos encontrar un medicamento sustituto a menor costo o llenar un formulario para que el seguro cubra el medicamento que se considera necesario.   Si se requiere una autorizacin previa para que su compaa de seguros cubra su medicamento, por favor permtanos de 1 a 2 das hbiles para completar este proceso.  Los precios de los medicamentos varan con frecuencia dependiendo del lugar de dnde se surte la receta y alguna farmacias pueden ofrecer precios ms baratos.  El sitio web www.goodrx.com tiene cupones para medicamentos de diferentes farmacias. Los precios aqu no  tienen en cuenta lo que podra costar con la ayuda del seguro (puede ser ms barato con su seguro), pero el sitio web puede darle el precio si no utiliz ningn seguro.  - Puede imprimir el cupn correspondiente y llevarlo con su receta a la farmacia.  - Tambin puede pasar por nuestra oficina durante el horario de atencin regular y recoger una tarjeta de cupones de GoodRx.  - Si necesita que su receta se enve electrnicamente a una farmacia diferente, informe a nuestra oficina a travs de MyChart de [place] o por telfono llamando al [phone] y presione la opcin 4.  

## 2021-12-06 NOTE — Progress Notes (Signed)
Follow-Up Visit   Subjective  Brad Patrick is a 66 y.o. male who presents for the following: Upper body skin exam (Hx of BCC, AKs). The patient presents for Upper Body Skin Exam (UBSE) for skin cancer screening and mole check.  The patient has spots, moles and lesions to be evaluated, some may be new or changing and the patient has concerns that these could be cancer.   The following portions of the chart were reviewed this encounter and updated as appropriate:   Tobacco  Allergies  Meds  Problems  Med Hx  Surg Hx  Fam Hx     Review of Systems:  No other skin or systemic complaints except as noted in HPI or Assessment and Plan.  Objective  Well appearing patient in no apparent distress; mood and affect are within normal limits.  All skin waist up examined.  R temple x 1, R cheek x 1 (2) Pink scaly macules  bil eyebrows Erythema and scale bil eyebrows  R post shoulder Pearly plaque with telangiectasias 1.8cm        Assessment & Plan   Lentigines - Scattered tan macules - Due to sun exposure - Benign-appearing, observe - Recommend daily broad spectrum sunscreen SPF 30+ to sun-exposed areas, reapply every 2 hours as needed. - Call for any changes - shoulders  Seborrheic Keratoses - Stuck-on, waxy, tan-brown papules and/or plaques  - Benign-appearing - Discussed benign etiology and prognosis. - Observe - Call for any changes - trunk  Melanocytic Nevi - Tan-brown and/or pink-flesh-colored symmetric macules and papules - Benign appearing on exam today - Observation - Call clinic for new or changing moles - Recommend daily use of broad spectrum spf 30+ sunscreen to sun-exposed areas.  - face, trunk  Hemangiomas - Red papules - Discussed benign nature - Observe - Call for any changes - trunk  Actinic Damage - Chronic condition, secondary to cumulative UV/sun exposure - diffuse scaly erythematous macules with underlying dyspigmentation - Recommend  daily broad spectrum sunscreen SPF 30+ to sun-exposed areas, reapply every 2 hours as needed.  - Staying in the shade or wearing long sleeves, sun glasses (UVA+UVB protection) and wide brim hats (4-inch brim around the entire circumference of the hat) are also recommended for sun protection.  - Call for new or changing lesions.  Skin cancer screening performed today.   History of Basal Cell Carcinoma of the Skin - No evidence of recurrence today - Recommend regular full body skin exams - Recommend daily broad spectrum sunscreen SPF 30+ to sun-exposed areas, reapply every 2 hours as needed.  - Call if any new or changing lesions are noted between office visits  - R nasal bridge  AK (actinic keratosis) (2) R temple x 1, R cheek x 1  Destruction of lesion - R temple x 1, R cheek x 1 Complexity: simple   Destruction method: cryotherapy   Informed consent: discussed and consent obtained   Timeout:  patient name, date of birth, surgical site, and procedure verified Lesion destroyed using liquid nitrogen: Yes   Region frozen until ice ball extended beyond lesion: Yes   Outcome: patient tolerated procedure well with no complications   Post-procedure details: wound care instructions given    Seborrheic dermatitis bil eyebrows  Seborrheic Dermatitis  -  is a chronic persistent rash characterized by pinkness and scaling most commonly of the mid face but also can occur on the scalp (dandruff), ears; mid chest, mid back and groin.  It tends to  be exacerbated by stress and cooler weather.  People who have neurologic disease may experience new onset or exacerbation of existing seborrheic dermatitis.  The condition is not curable but treatable and can be controlled.  Patient declines treatment today.  Neoplasm of skin R post shoulder  Skin / nail biopsy Type of biopsy: tangential   Informed consent: discussed and consent obtained   Timeout: patient name, date of birth, surgical site, and  procedure verified   Procedure prep:  Patient was prepped and draped in usual sterile fashion Prep type:  Isopropyl alcohol Anesthesia: the lesion was anesthetized in a standard fashion   Anesthetic:  1% lidocaine w/ epinephrine 1-100,000 buffered w/ 8.4% NaHCO3 Instrument used: flexible razor blade   Outcome: patient tolerated procedure well   Post-procedure details: sterile dressing applied and wound care instructions given   Dressing type: bandage and bacitracin    Specimen 1 - Surgical pathology Differential Diagnosis: D48.5 R/O BCC Check Margins: No  Return in about 6 months (around 06/06/2022) for TBSE, Hx of BCC, Hx of AKs.  I, Othelia Pulling, RMA, am acting as scribe for Sarina Ser, MD . Documentation: I have reviewed the above documentation for accuracy and completeness, and I agree with the above.  Sarina Ser, MD

## 2021-12-11 ENCOUNTER — Telehealth: Payer: Self-pay

## 2021-12-11 NOTE — Telephone Encounter (Signed)
Patient notified. Surgery scheduled

## 2021-12-11 NOTE — Telephone Encounter (Signed)
-----   Message from Ralene Bathe, MD sent at 12/08/2021  6:14 PM EDT ----- Diagnosis Skin , right post shoulder BASAL CELL CARCINOMA, INFUNDIBULOCYSTIC TYPE  Cancer - BCC Schedule surgery

## 2021-12-11 NOTE — Telephone Encounter (Signed)
Left pt msg to call for bx results/sh 

## 2022-01-16 ENCOUNTER — Ambulatory Visit (INDEPENDENT_AMBULATORY_CARE_PROVIDER_SITE_OTHER): Payer: Medicare HMO | Admitting: Dermatology

## 2022-01-16 ENCOUNTER — Telehealth: Payer: Self-pay

## 2022-01-16 ENCOUNTER — Encounter: Payer: Self-pay | Admitting: Dermatology

## 2022-01-16 DIAGNOSIS — C44612 Basal cell carcinoma of skin of right upper limb, including shoulder: Secondary | ICD-10-CM

## 2022-01-16 MED ORDER — DOXYCYCLINE MONOHYDRATE 100 MG PO TABS: 100.0000 mg | ORAL_TABLET | Freq: Two times a day (BID) | ORAL | 0 refills | Status: AC

## 2022-01-16 MED ORDER — MUPIROCIN 2 % EX OINT: 1.0000 | TOPICAL_OINTMENT | Freq: Every day | CUTANEOUS | 0 refills | Status: AC

## 2022-01-16 NOTE — Progress Notes (Signed)
Follow-Up Visit   Subjective  Brad Patrick is a 66 y.o. male who presents for the following: BCC bx proven (R post shoulder, pt presents for excision ).  The following portions of the chart were reviewed this encounter and updated as appropriate:   Tobacco  Allergies  Meds  Problems  Med Hx  Surg Hx  Fam Hx     Review of Systems:  No other skin or systemic complaints except as noted in HPI or Assessment and Plan.  Objective  Well appearing patient in no apparent distress; mood and affect are within normal limits.  A focused examination was performed including right shoulder. Relevant physical exam findings are noted in the Assessment and Plan.  R posterior shoulder Pink bx site 2.2cm   Assessment & Plan  Basal cell carcinoma (BCC) of skin of right upper extremity including shoulder R posterior shoulder  Skin excision  Lesion length (cm):  2.2 Lesion width (cm):  2.2 Margin per side (cm):  0.2 Total excision diameter (cm):  2.6 Informed consent: discussed and consent obtained   Timeout: patient name, date of birth, surgical site, and procedure verified   Procedure prep:  Patient was prepped and draped in usual sterile fashion Prep type:  Isopropyl alcohol and povidone-iodine Anesthesia: the lesion was anesthetized in a standard fashion   Anesthetic:  1% lidocaine w/ epinephrine 1-100,000 buffered w/ 8.4% NaHCO3 (9cc lido w/ epi, 9cc bupivicaine, Total of 18cc) Instrument used: #15 blade   Hemostasis achieved with: pressure   Hemostasis achieved with comment:  Electrocautery Outcome: patient tolerated procedure well with no complications   Post-procedure details: sterile dressing applied and wound care instructions given   Dressing type: bandage, pressure dressing and bacitracin (Mupirocin)    Skin repair Complexity:  Complex Final length (cm):  5.5 Reason for type of repair: reduce tension to allow closure, reduce the risk of dehiscence, infection, and  necrosis, reduce subcutaneous dead space and avoid a hematoma, allow closure of the large defect, preserve normal anatomy, preserve normal anatomical and functional relationships and enhance both functionality and cosmetic results   Undermining: area extensively undermined   Undermining comment:  Undermining Defect 2.6cm Subcutaneous layers (deep stitches):  Suture size:  2-0 Suture type: Vicryl (polyglactin 910)   Subcutaneous suture technique: Inverted Dermal. Fine/surface layer approximation (top stitches):  Suture size:  2-0 Suture type: nylon   Stitches: horizontal mattress and simple running   Stitches comment:  Running suture with 1 horizontal mattress Suture removal (days):  7 Hemostasis achieved with: pressure Outcome: patient tolerated procedure well with no complications   Post-procedure details: sterile dressing applied and wound care instructions given   Dressing type: bandage, pressure dressing and bacitracin (Mupirocin)    mupirocin ointment (BACTROBAN) 2 % Apply 1 Application topically daily. Qd to excision site  doxycycline (ADOXA) 100 MG tablet Take 1 tablet (100 mg total) by mouth 2 (two) times daily for 5 days. Take with food and drink  Specimen 1 - Surgical pathology Differential Diagnosis: Bx proven BCC  Check Margins: yes Pink bx site 2.2cm [id number]  BCC bx proven, excised today Start Mupirocin oint qd to excision site Start Doxycycline '100mg'$  1 po bid with food and drink for 5 days  Doxycycline should be taken with food to prevent nausea. Do not lay down for 30 minutes after taking. Be cautious with sun exposure and use good sun protection while on this medication. Pregnant women should not take this medication.     Return in  about 1 week (around 01/23/2022) for suture removal.  I, Othelia Pulling, RMA, am acting as scribe for Sarina Ser, MD . Documentation: I have reviewed the above documentation for accuracy and completeness, and I agree with the  above.  Sarina Ser, MD

## 2022-01-16 NOTE — Telephone Encounter (Signed)
Left pt msg to call if any problems after today's surgery./sh °

## 2022-01-16 NOTE — Patient Instructions (Addendum)
Wound Care Instructions  On the day following your surgery, you should begin doing daily dressing changes: Remove the old dressing and discard it. Cleanse the wound gently with tap water. This may be done in the shower or by placing a wet gauze pad directly on the wound and letting it soak for several minutes. It is important to gently remove any dried blood from the wound in order to encourage healing. This may be done by gently rolling a moistened Q-tip on the dried blood. Do not pick at the wound. If the wound should start to bleed, continue cleaning the wound, then place a moist gauze pad on the wound and hold pressure for a few minutes.  Make sure you then dry the skin surrounding the wound completely or the tape will not stick to the skin. Do not use cotton balls on the wound. After the wound is clean and dry, apply the ointment gently with a Q-tip. Cut a non-stick pad to fit the size of the wound. Lay the pad flush to the wound. If the wound is draining, you may want to reinforce it with a small amount of gauze on top of the non-stick pad for a little added compression to the area. Use the tape to seal the area completely. Select from the following with respect to your individual situation: If your wound has been stitched closed: continue the above steps 1-8 at least daily until your sutures are removed. If your wound has been left open to heal: continue steps 1-8 at least daily for the first 3-4 weeks. We would like for you to take a few extra precautions for at least the next week. Sleep with your head elevated on pillows if our wound is on your head. Do not bend over or lift heavy items to reduce the chance of elevated blood pressure to the wound Do not participate in particularly strenuous activities.   Below is a list of dressing supplies you might need.  Cotton-tipped applicators - Q-tips Gauze pads (2x2 and/or 4x4) - All-Purpose Sponges Non-stick dressing material - Telfa Tape -  Paper or Hypafix New and clean tube of petroleum jelly - Vaseline    Comments on Post-Operative Period Slight swelling and redness often appear around the wound. This is normal and will disappear within several days following the surgery. The healing wound will drain a brownish-red-yellow discharge during healing. This is a normal phase of wound healing. As the wound begins to heal, the drainage may increase in amount. Again, this drainage is normal. Notify us if the drainage becomes persistently bloody, excessively swollen, or intensely painful or develops a foul odor or red streaks.  If you should experience mild discomfort during the healing phase, you may take an aspirin-free medication such as Tylenol (acetaminophen). Notify us if the discomfort is severe or persistent. Avoid alcoholic beverages when taking pain medicine.  In Case of Wound Hemorrhage A wound hemorrhage is when the bandage suddenly becomes soaked with bright red blood and flows profusely. If this happens, sit down or lie down with your head elevated. If the wound has a dressing on it, do not remove the dressing. Apply pressure to the existing gauze. If the wound is not covered, use a gauze pad to apply pressure and continue applying the pressure for 20 minutes without peeking. DO NOT COVER THE WOUND WITH A LARGE TOWEL OR WASH CLOTH. Release your hand from the wound site but do not remove the dressing. If the bleeding has stopped,   gently clean around the wound. Leave the dressing in place for 24 hours if possible. This wait time allows the blood vessels to close off so that you do not spark a new round of bleeding by disrupting the newly clotted blood vessels with an immediate dressing change. If the bleeding does not subside, continue to hold pressure. If matters are out of your control, contact an After Hours clinic or go to the Emergency Room.  Doxycycline should be taken with food to prevent nausea. Do not lay down for 30 minutes  after taking. Be cautious with sun exposure and use good sun protection while on this medication. Pregnant women should not take this medication.    Due to recent changes in healthcare laws, you may see results of your pathology and/or laboratory studies on MyChart before the doctors have had a chance to review them. We understand that in some cases there may be results that are confusing or concerning to you. Please understand that not all results are received at the same time and often the doctors may need to interpret multiple results in order to provide you with the best plan of care or course of treatment. Therefore, we ask that you please give [place] 2 business days to thoroughly review all your results before contacting the office for clarification. Should we see a critical lab result, you will be contacted sooner.   If You Need Anything After Your Visit  If you have any questions or concerns for your doctor, please call our main line at [phone] and press option 4 to reach your doctor's medical assistant. If no one answers, please leave a voicemail as directed and we will return your call as soon as possible. Messages left after 4 pm will be answered the following business day.   You may also send [place] a message via [place]. We typically respond to MyChart messages within 1-2 business days.  For prescription refills, please ask your pharmacy to contact our office. Our fax number is [phone].  If you have an urgent issue when the clinic is closed that cannot wait until the next business day, you can page your doctor at the number below.    Please note that while we do our best to be available for urgent issues outside of office hours, we are not available 24/7.   If you have an urgent issue and are unable to reach [place], you may choose to seek medical care at your doctor's office, retail clinic, urgent care center, or emergency room.  If you have a medical emergency, please immediately call  911 or go to the emergency department.  Pager Numbers  - Dr. Nehemiah Massed: [phone]  - Dr. Laurence Ferrari: [phone]  - Dr. Nicole Kindred: [phone]  In the event of inclement weather, please call our main line at [phone] for an update on the status of any delays or closures.  Dermatology Medication Tips: Please keep the boxes that topical medications come in in order to help keep track of the instructions about where and how to use these. Pharmacies typically print the medication instructions only on the boxes and not directly on the medication tubes.   If your medication is too expensive, please contact our office at [phone] option 4 or send [place] a message through [place].   We are unable to tell what your co-pay for medications will be in advance as this is different depending on your insurance coverage. However, we may be able to find a substitute medication at lower  cost or fill out paperwork to get insurance to cover a needed medication.   If a prior authorization is required to get your medication covered by your insurance company, please allow [place] 1-2 business days to complete this process.  Drug prices often vary depending on where the prescription is filled and some pharmacies may offer cheaper prices.  The website www.goodrx.com contains coupons for medications through different pharmacies. The prices here do not account for what the cost may be with help from insurance (it may be cheaper with your insurance), but the website can give you the price if you did not use any insurance.  - You can print the associated coupon and take it with your prescription to the pharmacy.  - You may also stop by our office during regular business hours and pick up a GoodRx coupon card.  - If you need your prescription sent electronically to a different pharmacy, notify our office through [facility] or by phone at [phone] option 4.     Si Usted Necesita Algo Despus de Su  Visita  Tambin puede enviarnos un mensaje a travs de Pharmacist, community. Por lo general respondemos a los mensajes de MyChart en el transcurso de 1 a 2 das hbiles.  Para renovar recetas, por favor pida a su farmacia que se ponga en contacto con nuestra oficina. Harland Dingwall de fax es [place] [phone].  Si tiene un asunto urgente cuando la clnica est cerrada y que no puede esperar hasta el siguiente da hbil, puede llamar/localizar a su doctor(a) al nmero que aparece a continuacin.   Por favor, tenga en cuenta que aunque hacemos todo lo posible para estar disponibles para asuntos urgentes fuera del horario de [place], no estamos disponibles las 24 horas del da, los 7 das de la [place].   Si tiene un problema urgente y no puede comunicarse con nosotros, puede optar por buscar atencin mdica  en el consultorio de su doctor(a), en una clnica privada, en un centro de atencin urgente o en una sala de emergencias.  Si tiene Engineering geologist, por favor llame inmediatamente al 911 o vaya a la sala de emergencias.  Nmeros de bper  - Dr. Nehemiah Massed: [phone]  - Dra. Moye: [phone]  - Dra. Nicole Kindred: [phone]  En caso de inclemencias del [place], por favor llame a Johnsie Kindred principal al [phone] para una actualizacin sobre el [place] de cualquier retraso o cierre.  Consejos para la medicacin en dermatologa: Por favor, guarde las cajas en las que vienen los medicamentos de uso tpico para ayudarle a seguir las instrucciones sobre dnde y cmo usarlos. Las farmacias generalmente imprimen las instrucciones del medicamento slo en las cajas y no directamente en los tubos del [place].   Si su medicamento es muy caro, por favor, pngase en contacto con Zigmund Daniel llamando al [phone] y presione la opcin 4 o envenos un mensaje a travs de Pharmacist, community.   No podemos decirle cul ser su copago por los medicamentos por adelantado ya que esto es diferente dependiendo de la  cobertura de su seguro. Sin embargo, es posible que podamos encontrar un medicamento sustituto a Electrical engineer un formulario para que el seguro cubra el medicamento que se considera necesario.   Si se requiere una autorizacin previa para que su compaa de seguros [place] su medicamento, por favor permtanos de 1 a 2 das hbiles para completar este proceso.  Los precios de los medicamentos varan con frecuencia dependiendo del Environmental consultant de dnde se surte  la receta y alguna farmacias pueden ofrecer precios ms baratos.  El sitio web www.goodrx.com tiene cupones para medicamentos de Airline pilot. Los precios aqu no tienen en cuenta lo que podra costar con la ayuda del seguro (puede ser ms barato con su seguro), pero el sitio web puede darle el precio si no utiliz Research scientist (physical sciences).  - Puede imprimir el cupn correspondiente y llevarlo con su receta a la farmacia.  - Tambin puede pasar por nuestra oficina durante el horario de atencin regular y Charity fundraiser una tarjeta de cupones de GoodRx.  - Si necesita que su receta se enve electrnicamente a una farmacia diferente, informe a nuestra oficina a travs de MyChart de [place] o por telfono llamando al [phone] y presione la opcin 4.

## 2022-01-23 ENCOUNTER — Ambulatory Visit (INDEPENDENT_AMBULATORY_CARE_PROVIDER_SITE_OTHER): Payer: Medicare HMO | Admitting: Dermatology

## 2022-01-23 DIAGNOSIS — C44612 Basal cell carcinoma of skin of right upper limb, including shoulder: Secondary | ICD-10-CM

## 2022-01-23 DIAGNOSIS — Z4802 Encounter for removal of sutures: Secondary | ICD-10-CM

## 2022-01-23 NOTE — Patient Instructions (Signed)
Due to recent changes in healthcare laws, you may see results of your pathology and/or laboratory studies on MyChart before the doctors have had a chance to review them. We understand that in some cases there may be results that are confusing or concerning to you. Please understand that not all results are received at the same time and often the doctors may need to interpret multiple results in order to provide you with the best plan of care or course of treatment. Therefore, we ask that you please give us 2 business days to thoroughly review all your results before contacting the office for clarification. Should we see a critical lab result, you will be contacted sooner.   If You Need Anything After Your Visit  If you have any questions or concerns for your doctor, please call our main line at [phone] and press option 4 to reach your doctor's medical assistant. If no one answers, please leave a voicemail as directed and we will return your call as soon as possible. Messages left after 4 pm will be answered the following business day.   You may also send us a message via MyChart. We typically respond to MyChart messages within 1-2 business days.  For prescription refills, please ask your pharmacy to contact our office. Our fax number is [phone].  If you have an urgent issue when the clinic is closed that cannot wait until the next business day, you can page your doctor at the number below.    Please note that while we do our best to be available for urgent issues outside of office hours, we are not available 24/7.   If you have an urgent issue and are unable to reach us, you may choose to seek medical care at your doctor's office, retail clinic, urgent care center, or emergency room.  If you have a medical emergency, please immediately call 911 or go to the emergency department.  Pager Numbers  - Dr. Kowalski: [phone]  - Dr. Moye: [phone]  - Dr. Stewart:  [phone]  In the event of inclement weather, please call our main line at [phone] for an update on the status of any delays or closures.  Dermatology Medication Tips: Please keep the boxes that topical medications come in in order to help keep track of the instructions about where and how to use these. Pharmacies typically print the medication instructions only on the boxes and not directly on the medication tubes.   If your medication is too expensive, please contact our office at [phone] option 4 or send us a message through MyChart.   We are unable to tell what your co-pay for medications will be in advance as this is different depending on your insurance coverage. However, we may be able to find a substitute medication at lower cost or fill out paperwork to get insurance to cover a needed medication.   If a prior authorization is required to get your medication covered by your insurance company, please allow us 1-2 business days to complete this process.  Drug prices often vary depending on where the prescription is filled and some pharmacies may offer cheaper prices.  The website www.goodrx.com contains coupons for medications through different pharmacies. The prices here do not account for what the cost may be with help from insurance (it may be cheaper with your insurance), but the website can give you the price if you did not use any insurance.  - You can print the associated coupon and take it with   your prescription to the pharmacy.  - You may also stop by our office during regular business hours and pick up a GoodRx coupon card.  - If you need your prescription sent electronically to a different pharmacy, notify our office through [place] MyChart or by phone at [phone] option 4.     Si Usted Necesita Algo Despus de Su Visita  Tambin puede enviarnos un mensaje a travs de MyChart. Por lo general respondemos a los mensajes de MyChart en el transcurso de 1 a 2  das hbiles.  Para renovar recetas, por favor pida a su farmacia que se ponga en contacto con nuestra oficina. Nuestro nmero de fax es el [phone].  Si tiene un asunto urgente cuando la clnica est cerrada y que no puede esperar hasta el siguiente da hbil, puede llamar/localizar a su doctor(a) al nmero que aparece a continuacin.   Por favor, tenga en cuenta que aunque hacemos todo lo posible para estar disponibles para asuntos urgentes fuera del horario de oficina, no estamos disponibles las 24 horas del da, los 7 das de la semana.   Si tiene un problema urgente y no puede comunicarse con nosotros, puede optar por buscar atencin mdica  en el consultorio de su doctor(a), en una clnica privada, en un centro de atencin urgente o en una sala de emergencias.  Si tiene una emergencia mdica, por favor llame inmediatamente al 911 o vaya a la sala de emergencias.  Nmeros de bper  - Dr. Kowalski: [phone]  - Dra. Moye: [phone]  - Dra. Stewart: [phone]  En caso de inclemencias del tiempo, por favor llame a nuestra lnea principal al [phone] para una actualizacin sobre el estado de cualquier retraso o cierre.  Consejos para la medicacin en dermatologa: Por favor, guarde las cajas en las que vienen los medicamentos de uso tpico para ayudarle a seguir las instrucciones sobre dnde y cmo usarlos. Las farmacias generalmente imprimen las instrucciones del medicamento slo en las cajas y no directamente en los tubos del medicamento.   Si su medicamento es muy caro, por favor, pngase en contacto con nuestra oficina llamando al [phone] y presione la opcin 4 o envenos un mensaje a travs de MyChart.   No podemos decirle cul ser su copago por los medicamentos por adelantado ya que esto es diferente dependiendo de la cobertura de su seguro. Sin embargo, es posible que podamos encontrar un medicamento sustituto a menor costo o llenar un formulario para que el  seguro cubra el medicamento que se considera necesario.   Si se requiere una autorizacin previa para que su compaa de seguros cubra su medicamento, por favor permtanos de 1 a 2 das hbiles para completar este proceso.  Los precios de los medicamentos varan con frecuencia dependiendo del lugar de dnde se surte la receta y alguna farmacias pueden ofrecer precios ms baratos.  El sitio web www.goodrx.com tiene cupones para medicamentos de diferentes farmacias. Los precios aqu no tienen en cuenta lo que podra costar con la ayuda del seguro (puede ser ms barato con su seguro), pero el sitio web puede darle el precio si no utiliz ningn seguro.  - Puede imprimir el cupn correspondiente y llevarlo con su receta a la farmacia.  - Tambin puede pasar por nuestra oficina durante el horario de atencin regular y recoger una tarjeta de cupones de GoodRx.  - Si necesita que su receta se enve electrnicamente a una farmacia diferente, informe a nuestra oficina a travs de MyChart de [place]   o por telfono llamando al [phone] y presione la opcin 4.  

## 2022-01-23 NOTE — Progress Notes (Signed)
   Follow-Up Visit   Subjective  Brad Patrick is a 66 y.o. male who presents for the following: BCC margins free, bx proven (R post shoulder, pt presents for suture removal).  The following portions of the chart were reviewed this encounter and updated as appropriate:   Tobacco  Allergies  Meds  Problems  Med Hx  Surg Hx  Fam Hx     Review of Systems:  No other skin or systemic complaints except as noted in HPI or Assessment and Plan.  Objective  Well appearing patient in no apparent distress; mood and affect are within normal limits.  A focused examination was performed including right posterior shoulder. Relevant physical exam findings are noted in the Assessment and Plan.  R post shoulder Healing excision site   Assessment & Plan  Basal cell carcinoma (BCC) of skin of right upper extremity including shoulder R post shoulder  BCC margins free, bx proven Healing excision site  Encounter for Removal of Sutures - Incision site at the right posterior shoulder is clean, dry and intact - Wound cleansed, sutures removed, wound cleansed and steri strips applied.  - Discussed pathology results showing BCC margins free  - Patient advised to keep steri-strips dry until they fall off. - Scars remodel for a full year. - Once steri-strips fall off, patient can apply over-the-counter silicone scar cream each night to help with scar remodeling if desired. - Patient advised to call with any concerns or if they notice any new or changing lesions.   Related Medications mupirocin ointment (BACTROBAN) 2 % Apply 1 Application topically daily. Qd to excision site   Return for as scheduled for , TBSE, Hx of BCC, Hx of AKs.  I, Othelia Pulling, RMA, am acting as scribe for Sarina Ser, MD . Documentation: I have reviewed the above documentation for accuracy and completeness, and I agree with the above.  Sarina Ser, MD

## 2022-01-26 ENCOUNTER — Encounter: Payer: Self-pay | Admitting: Dermatology

## 2022-02-06 ENCOUNTER — Encounter: Payer: Self-pay | Admitting: Dermatology

## 2022-02-06 DIAGNOSIS — C44311 Basal cell carcinoma of skin of nose: Secondary | ICD-10-CM | POA: Diagnosis not present

## 2022-03-24 DIAGNOSIS — Z5321 Procedure and treatment not carried out due to patient leaving prior to being seen by health care provider: Secondary | ICD-10-CM | POA: Diagnosis not present

## 2022-03-24 DIAGNOSIS — E785 Hyperlipidemia, unspecified: Secondary | ICD-10-CM | POA: Diagnosis not present

## 2022-03-24 DIAGNOSIS — M25572 Pain in left ankle and joints of left foot: Secondary | ICD-10-CM | POA: Diagnosis not present

## 2022-03-24 DIAGNOSIS — I10 Essential (primary) hypertension: Secondary | ICD-10-CM | POA: Diagnosis not present

## 2022-03-24 DIAGNOSIS — M79672 Pain in left foot: Secondary | ICD-10-CM | POA: Diagnosis not present

## 2022-03-24 DIAGNOSIS — M25472 Effusion, left ankle: Secondary | ICD-10-CM | POA: Diagnosis not present

## 2022-03-24 DIAGNOSIS — R600 Localized edema: Secondary | ICD-10-CM | POA: Diagnosis not present

## 2022-03-24 DIAGNOSIS — F17290 Nicotine dependence, other tobacco product, uncomplicated: Secondary | ICD-10-CM | POA: Diagnosis not present

## 2022-03-24 DIAGNOSIS — Z8546 Personal history of malignant neoplasm of prostate: Secondary | ICD-10-CM | POA: Diagnosis not present

## 2022-03-27 DIAGNOSIS — M25572 Pain in left ankle and joints of left foot: Secondary | ICD-10-CM | POA: Diagnosis not present

## 2022-04-12 DIAGNOSIS — Z923 Personal history of irradiation: Secondary | ICD-10-CM | POA: Diagnosis not present

## 2022-04-12 DIAGNOSIS — K648 Other hemorrhoids: Secondary | ICD-10-CM | POA: Diagnosis not present

## 2022-04-12 DIAGNOSIS — K921 Melena: Secondary | ICD-10-CM | POA: Diagnosis not present

## 2022-04-12 DIAGNOSIS — Z8546 Personal history of malignant neoplasm of prostate: Secondary | ICD-10-CM | POA: Diagnosis not present

## 2022-05-28 DIAGNOSIS — Z0000 Encounter for general adult medical examination without abnormal findings: Secondary | ICD-10-CM | POA: Diagnosis not present

## 2022-05-28 DIAGNOSIS — I10 Essential (primary) hypertension: Secondary | ICD-10-CM | POA: Diagnosis not present

## 2022-05-28 DIAGNOSIS — Z79899 Other long term (current) drug therapy: Secondary | ICD-10-CM | POA: Diagnosis not present

## 2022-05-28 DIAGNOSIS — E7800 Pure hypercholesterolemia, unspecified: Secondary | ICD-10-CM | POA: Diagnosis not present

## 2022-05-28 DIAGNOSIS — Z125 Encounter for screening for malignant neoplasm of prostate: Secondary | ICD-10-CM | POA: Diagnosis not present

## 2022-05-28 DIAGNOSIS — Z8546 Personal history of malignant neoplasm of prostate: Secondary | ICD-10-CM | POA: Diagnosis not present

## 2022-05-28 DIAGNOSIS — M1000 Idiopathic gout, unspecified site: Secondary | ICD-10-CM | POA: Diagnosis not present

## 2022-06-06 ENCOUNTER — Encounter: Payer: Self-pay | Admitting: Dermatology

## 2022-06-06 ENCOUNTER — Ambulatory Visit (INDEPENDENT_AMBULATORY_CARE_PROVIDER_SITE_OTHER): Payer: Medicare HMO | Admitting: Dermatology

## 2022-06-06 VITALS — BP 134/81 | HR 76

## 2022-06-06 DIAGNOSIS — D229 Melanocytic nevi, unspecified: Secondary | ICD-10-CM | POA: Diagnosis not present

## 2022-06-06 DIAGNOSIS — Z85828 Personal history of other malignant neoplasm of skin: Secondary | ICD-10-CM | POA: Diagnosis not present

## 2022-06-06 DIAGNOSIS — L821 Other seborrheic keratosis: Secondary | ICD-10-CM

## 2022-06-06 DIAGNOSIS — L918 Other hypertrophic disorders of the skin: Secondary | ICD-10-CM

## 2022-06-06 DIAGNOSIS — L578 Other skin changes due to chronic exposure to nonionizing radiation: Secondary | ICD-10-CM | POA: Diagnosis not present

## 2022-06-06 DIAGNOSIS — Z1283 Encounter for screening for malignant neoplasm of skin: Secondary | ICD-10-CM

## 2022-06-06 DIAGNOSIS — L814 Other melanin hyperpigmentation: Secondary | ICD-10-CM

## 2022-06-06 DIAGNOSIS — D1801 Hemangioma of skin and subcutaneous tissue: Secondary | ICD-10-CM

## 2022-06-06 NOTE — Progress Notes (Signed)
   Follow-Up Visit   Subjective  Brad Patrick is a 67 y.o. male who presents for the following: Skin Cancer Screening and Full Body Skin Exam, hx of BCC, hx of precancers  The patient presents for Total-Body Skin Exam (TBSE) for skin cancer screening and mole check. The patient has spots, moles and lesions to be evaluated, some may be new or changing and the patient has concerns that these could be cancer.  The following portions of the chart were reviewed this encounter and updated as appropriate: medications, allergies, medical history  Review of Systems:  No other skin or systemic complaints except as noted in HPI or Assessment and Plan.  Objective  Well appearing patient in no apparent distress; mood and affect are within normal limits.  A full examination was performed including scalp, head, eyes, ears, nose, lips, neck, chest, axillae, abdomen, back, buttocks, bilateral upper extremities, bilateral lower extremities, hands, feet, fingers, toes, fingernails, and toenails. All findings within normal limits unless otherwise noted below.   Relevant physical exam findings are noted in the Assessment and Plan.   Assessment & Plan   LENTIGINES, SEBORRHEIC KERATOSES, HEMANGIOMAS - Benign normal skin lesions - Benign-appearing - Call for any changes  MELANOCYTIC NEVI - Tan-brown and/or pink-flesh-colored symmetric macules and papules - Benign appearing on exam today - Observation - Call clinic for new or changing moles - Recommend daily use of broad spectrum spf 30+ sunscreen to sun-exposed areas.   ACTINIC DAMAGE - Chronic condition, secondary to cumulative UV/sun exposure - diffuse scaly erythematous macules with underlying dyspigmentation - Recommend daily broad spectrum sunscreen SPF 30+ to sun-exposed areas, reapply every 2 hours as needed.  - Staying in the shade or wearing long sleeves, sun glasses (UVA+UVB protection) and wide brim hats (4-inch brim around the entire  circumference of the hat) are also recommended for sun protection.  - Call for new or changing lesions.  Acrochordons (Skin Tags) - Fleshy, skin-colored pedunculated papules - Benign appearing.  - Observe. - If desired, they can be removed with an in office procedure that is not covered by insurance. - Please call the clinic if you notice any new or changing lesions.   HISTORY OF BASAL CELL CARCINOMA OF THE SKIN Multiple see history  - No evidence of recurrence today - Recommend regular full body skin exams - Recommend daily broad spectrum sunscreen SPF 30+ to sun-exposed areas, reapply every 2 hours as needed.  - Call if any new or changing lesions are noted between office visits   SKIN CANCER SCREENING PERFORMED TODAY.   Return in about 1 year (around 06/06/2023) for TBSE. hx of BCC .  IMarye Patrick, CMA, am acting as scribe for Brad Ser, MD .   Documentation: I have reviewed the above documentation for accuracy and completeness, and I agree with the above.  Brad Ser, MD

## 2022-06-06 NOTE — Patient Instructions (Addendum)
Seborrheic Keratosis Areas On the back   What causes seborrheic keratoses? Seborrheic keratoses are harmless, common skin growths that first appear during adult life.  As time goes by, more growths appear.  Some people may develop a large number of them.  Seborrheic keratoses appear on both covered and uncovered body parts.  They are not caused by sunlight.  The tendency to develop seborrheic keratoses can be inherited.  They vary in color from skin-colored to gray, brown, or even black.  They can be either smooth or have a rough, warty surface.   Seborrheic keratoses are superficial and look as if they were stuck on the skin.  Under the microscope this type of keratosis looks like layers upon layers of skin.  That is why at times the top layer may seem to fall off, but the rest of the growth remains and re-grows.    Treatment Seborrheic keratoses do not need to be treated, but can easily be removed in the office.  Seborrheic keratoses often cause symptoms when they rub on clothing or jewelry.  Lesions can be in the way of shaving.  If they become inflamed, they can cause itching, soreness, or burning.  Removal of a seborrheic keratosis can be accomplished by freezing, burning, or surgery. If any spot bleeds, scabs, or grows rapidly, please return to have it checked, as these can be an indication of a skin cancer.       Due to recent changes in healthcare laws, you may see results of your pathology and/or laboratory studies on MyChart before the doctors have had a chance to review them. We understand that in some cases there may be results that are confusing or concerning to you. Please understand that not all results are received at the same time and often the doctors may need to interpret multiple results in order to provide you with the best plan of care or course of treatment. Therefore, we ask that you please give [place] 2 business days to thoroughly review all your results before contacting the  office for clarification. Should we see a critical lab result, you will be contacted sooner.   If You Need Anything After Your Visit  If you have any questions or concerns for your doctor, please call our main line at [phone] and press option 4 to reach your doctor's medical assistant. If no one answers, please leave a voicemail as directed and we will return your call as soon as possible. Messages left after 4 pm will be answered the following business day.   You may also send [place] a message via [place]. We typically respond to MyChart messages within 1-2 business days.  For prescription refills, please ask your pharmacy to contact our office. Our fax number is [phone].  If you have an urgent issue when the clinic is closed that cannot wait until the next business day, you can page your doctor at the number below.    Please note that while we do our best to be available for urgent issues outside of office hours, we are not available 24/7.   If you have an urgent issue and are unable to reach [place], you may choose to seek medical care at your doctor's office, retail clinic, urgent care center, or emergency room.  If you have a medical emergency, please immediately call 911 or go to the emergency department.  Pager Numbers  - Dr. Nehemiah Massed: [phone]  - Dr. Laurence Ferrari: [phone]  - Dr. Nicole Kindred: [phone]  In the  event of inclement weather, please call our main line at [phone] for an update on the status of any delays or closures.  Dermatology Medication Tips: Please keep the boxes that topical medications come in in order to help keep track of the instructions about where and how to use these. Pharmacies typically print the medication instructions only on the boxes and not directly on the medication tubes.   If your medication is too expensive, please contact our office at [phone] option 4 or send [place] a message through [place].   We are unable to tell what your co-pay  for medications will be in advance as this is different depending on your insurance coverage. However, we may be able to find a substitute medication at lower cost or fill out paperwork to get insurance to cover a needed medication.   If a prior authorization is required to get your medication covered by your insurance company, please allow [place] 1-2 business days to complete this process.  Drug prices often vary depending on where the prescription is filled and some pharmacies may offer cheaper prices.  The website www.goodrx.com contains coupons for medications through different pharmacies. The prices here do not account for what the cost may be with help from insurance (it may be cheaper with your insurance), but the website can give you the price if you did not use any insurance.  - You can print the associated coupon and take it with your prescription to the pharmacy.  - You may also stop by our office during regular business hours and pick up a GoodRx coupon card.  - If you need your prescription sent electronically to a different pharmacy, notify our office through [facility] or by phone at [phone] option 4.     Si Usted Necesita Algo Despus de Su Visita  Tambin puede enviarnos un mensaje a travs de Pharmacist, community. Por lo general respondemos a los mensajes de MyChart en el transcurso de 1 a 2 das hbiles.  Para renovar recetas, por favor pida a su farmacia que se ponga en contacto con nuestra oficina. Harland Dingwall de fax es [place] [phone].  Si tiene un asunto urgente cuando la clnica est cerrada y que no puede esperar hasta el siguiente da hbil, puede llamar/localizar a su doctor(a) al nmero que aparece a continuacin.   Por favor, tenga en cuenta que aunque hacemos todo lo posible para estar disponibles para asuntos urgentes fuera del horario de [place], no estamos disponibles las 24 horas del da, los 7 das de la [place].   Si tiene un problema urgente y no puede  comunicarse con nosotros, puede optar por buscar atencin mdica  en el consultorio de su doctor(a), en una clnica privada, en un centro de atencin urgente o en una sala de emergencias.  Si tiene Engineering geologist, por favor llame inmediatamente al 911 o vaya a la sala de emergencias.  Nmeros de bper  - Dr. Nehemiah Massed: [phone]  - Dra. Moye: [phone]  - Dra. Nicole Kindred: [phone]  En caso de inclemencias del [place], por favor llame a Johnsie Kindred principal al [phone] para una actualizacin sobre el [place] de cualquier retraso o cierre.  Consejos para la medicacin en dermatologa: Por favor, guarde las cajas en las que vienen los medicamentos de uso tpico para ayudarle a seguir las instrucciones sobre dnde y cmo usarlos. Las farmacias generalmente imprimen las instrucciones del medicamento slo en las cajas y no directamente en los tubos del [place].   Si su  medicamento es [place] caro, por favor, pngase en contacto con Zigmund Daniel llamando al [phone] y presione la opcin 4 o envenos un mensaje a travs de Pharmacist, community.   No podemos decirle cul ser su copago por los medicamentos por adelantado ya que esto es diferente dependiendo de la cobertura de su seguro. Sin embargo, es posible que podamos encontrar un medicamento sustituto a Electrical engineer un formulario para que el seguro cubra el medicamento que se considera necesario.   Si se requiere una autorizacin previa para que su compaa de seguros [place] su medicamento, por favor permtanos de 1 a 2 das hbiles para completar este proceso.  Los precios de los medicamentos varan con frecuencia dependiendo del Environmental consultant de dnde se surte la receta y alguna farmacias pueden ofrecer precios ms baratos.  El sitio web www.goodrx.com tiene cupones para medicamentos de Airline pilot. Los precios aqu no tienen en cuenta lo que podra costar con la ayuda del seguro (puede ser ms barato con su seguro), pero  el sitio web puede darle el precio si no utiliz Research scientist (physical sciences).  - Puede imprimir el cupn correspondiente y llevarlo con su receta a la farmacia.  - Tambin puede pasar por nuestra oficina durante el horario de atencin regular y Charity fundraiser una tarjeta de cupones de GoodRx.  - Si necesita que su receta se enve electrnicamente a una farmacia diferente, informe a nuestra oficina a travs de MyChart de [place] o por telfono llamando al [phone] y presione la opcin 4.

## 2022-07-05 DIAGNOSIS — H903 Sensorineural hearing loss, bilateral: Secondary | ICD-10-CM | POA: Diagnosis not present

## 2022-11-29 DIAGNOSIS — Z79899 Other long term (current) drug therapy: Secondary | ICD-10-CM | POA: Diagnosis not present

## 2022-11-29 DIAGNOSIS — E785 Hyperlipidemia, unspecified: Secondary | ICD-10-CM | POA: Diagnosis not present

## 2022-11-29 DIAGNOSIS — R972 Elevated prostate specific antigen [PSA]: Secondary | ICD-10-CM | POA: Diagnosis not present

## 2022-11-29 DIAGNOSIS — Z0000 Encounter for general adult medical examination without abnormal findings: Secondary | ICD-10-CM | POA: Diagnosis not present

## 2022-11-29 DIAGNOSIS — I10 Essential (primary) hypertension: Secondary | ICD-10-CM | POA: Diagnosis not present

## 2023-06-04 DIAGNOSIS — Z8546 Personal history of malignant neoplasm of prostate: Secondary | ICD-10-CM | POA: Diagnosis not present

## 2023-06-04 DIAGNOSIS — Z0000 Encounter for general adult medical examination without abnormal findings: Secondary | ICD-10-CM | POA: Diagnosis not present

## 2023-06-04 DIAGNOSIS — E785 Hyperlipidemia, unspecified: Secondary | ICD-10-CM | POA: Diagnosis not present

## 2023-06-04 DIAGNOSIS — Z79899 Other long term (current) drug therapy: Secondary | ICD-10-CM | POA: Diagnosis not present

## 2023-06-04 DIAGNOSIS — I10 Essential (primary) hypertension: Secondary | ICD-10-CM | POA: Diagnosis not present

## 2023-06-04 DIAGNOSIS — Z125 Encounter for screening for malignant neoplasm of prostate: Secondary | ICD-10-CM | POA: Diagnosis not present

## 2023-06-06 ENCOUNTER — Ambulatory Visit: Payer: Medicare HMO | Admitting: Dermatology

## 2023-07-15 ENCOUNTER — Ambulatory Visit: Admitting: Dermatology

## 2023-07-15 ENCOUNTER — Encounter: Payer: Self-pay | Admitting: Dermatology

## 2023-07-15 DIAGNOSIS — Z1283 Encounter for screening for malignant neoplasm of skin: Secondary | ICD-10-CM | POA: Diagnosis not present

## 2023-07-15 DIAGNOSIS — D492 Neoplasm of unspecified behavior of bone, soft tissue, and skin: Secondary | ICD-10-CM

## 2023-07-15 DIAGNOSIS — L821 Other seborrheic keratosis: Secondary | ICD-10-CM | POA: Diagnosis not present

## 2023-07-15 DIAGNOSIS — L814 Other melanin hyperpigmentation: Secondary | ICD-10-CM | POA: Diagnosis not present

## 2023-07-15 DIAGNOSIS — L578 Other skin changes due to chronic exposure to nonionizing radiation: Secondary | ICD-10-CM

## 2023-07-15 DIAGNOSIS — L570 Actinic keratosis: Secondary | ICD-10-CM

## 2023-07-15 DIAGNOSIS — Z85828 Personal history of other malignant neoplasm of skin: Secondary | ICD-10-CM

## 2023-07-15 DIAGNOSIS — Z872 Personal history of diseases of the skin and subcutaneous tissue: Secondary | ICD-10-CM

## 2023-07-15 DIAGNOSIS — D229 Melanocytic nevi, unspecified: Secondary | ICD-10-CM

## 2023-07-15 DIAGNOSIS — C4401 Basal cell carcinoma of skin of lip: Secondary | ICD-10-CM | POA: Diagnosis not present

## 2023-07-15 DIAGNOSIS — W908XXA Exposure to other nonionizing radiation, initial encounter: Secondary | ICD-10-CM

## 2023-07-15 DIAGNOSIS — C4491 Basal cell carcinoma of skin, unspecified: Secondary | ICD-10-CM

## 2023-07-15 DIAGNOSIS — D1801 Hemangioma of skin and subcutaneous tissue: Secondary | ICD-10-CM

## 2023-07-15 DIAGNOSIS — D489 Neoplasm of uncertain behavior, unspecified: Secondary | ICD-10-CM

## 2023-07-15 HISTORY — DX: Basal cell carcinoma of skin, unspecified: C44.91

## 2023-07-15 NOTE — Progress Notes (Signed)
 Follow-Up Visit   Subjective  Brad Patrick is a 68 y.o. male who presents for the following: Skin Cancer Screening and Full Body Skin Exam Hx of aks, hx of bcc  The patient presents for Total-Body Skin Exam (TBSE) for skin cancer screening and mole check. The patient has spots, moles and lesions to be evaluated, some may be new or changing and the patient may have concern these could be cancer.  The following portions of the chart were reviewed this encounter and updated as appropriate: medications, allergies, medical history  Review of Systems:  No other skin or systemic complaints except as noted in HPI or Assessment and Plan.  Objective  Well appearing patient in no apparent distress; mood and affect are within normal limits.  A full examination was performed including scalp, head, eyes, ears, nose, lips, neck, chest, axillae, abdomen, back, buttocks, bilateral upper extremities, bilateral lower extremities, hands, feet, fingers, toes, fingernails, and toenails. All findings within normal limits unless otherwise noted below.   Relevant physical exam findings are noted in the Assessment and Plan.  left upper lip 1.1 cm red crusted papule   right face x 4 (4) Erythematous thin papules/macules with gritty scale.   Assessment & Plan   SKIN CANCER SCREENING PERFORMED TODAY.  ACTINIC DAMAGE - Chronic condition, secondary to cumulative UV/sun exposure - diffuse scaly erythematous macules with underlying dyspigmentation - Recommend daily broad spectrum sunscreen SPF 30+ to sun-exposed areas, reapply every 2 hours as needed.  - Staying in the shade or wearing long sleeves, sun glasses (UVA+UVB protection) and wide brim hats (4-inch brim around the entire circumference of the hat) are also recommended for sun protection.  - Call for new or changing lesions.  LENTIGINES, SEBORRHEIC KERATOSES, HEMANGIOMAS - Benign normal skin lesions - Benign-appearing - Call for any  changes  MELANOCYTIC NEVI - Tan-brown and/or pink-flesh-colored symmetric macules and papules - Benign appearing on exam today - Observation - Call clinic for new or changing moles - Recommend daily use of broad spectrum spf 30+ sunscreen to sun-exposed areas.   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  - Call if any new or changing lesions are noted between office visits    NEOPLASM OF UNCERTAIN BEHAVIOR left upper lip Skin / nail biopsy Type of biopsy: tangential   Informed consent: discussed and consent obtained   Timeout: patient name, date of birth, surgical site, and procedure verified   Procedure prep:  Patient was prepped and draped in usual sterile fashion Prep type:  Isopropyl alcohol Anesthesia: the lesion was anesthetized in a standard fashion   Anesthetic:  1% lidocaine  w/ epinephrine  1-100,000 buffered w/ 8.4% NaHCO3 Instrument used: flexible razor blade   Hemostasis achieved with: pressure, aluminum chloride and electrodesiccation   Outcome: patient tolerated procedure well   Post-procedure details: sterile dressing applied and wound care instructions given   Dressing type: petrolatum and bandage   Specimen 1 - Surgical pathology Differential Diagnosis: r/o bcc  Check Margins: No R/o bcc ACTINIC KERATOSIS (4) right face x 4 (4) Actinic keratoses are precancerous spots that appear secondary to cumulative UV radiation exposure/sun exposure over time. They are chronic with expected duration over 1 year. A portion of actinic  keratoses will progress to squamous cell carcinoma of the skin. It is not possible to reliably predict which spots will progress to skin cancer and so treatment  is recommended to prevent development of skin cancer.  Recommend daily broad spectrum sunscreen SPF 30+ to sun-exposed areas, reapply every 2 hours as needed.  Recommend staying in the shade or wearing long sleeves, sun glasses (UVA+UVB protection) and wide brim hats (4-inch brim around the entire circumference of the hat). Call for new or changing lesions. Destruction of lesion - right face x 4 (4) Complexity: simple   Destruction method: cryotherapy   Informed consent: discussed and consent obtained   Timeout:  patient name, date of birth, surgical site, and procedure verified Lesion destroyed using liquid nitrogen: Yes   Region frozen until ice ball extended beyond lesion: Yes   Outcome: patient tolerated procedure well with no complications   Post-procedure details: wound care instructions given   Return in about 1 year (around 07/14/2024) for TBSE.  IRandee Busing, CMA, am acting as scribe for Celine Collard, MD.   Documentation: I have reviewed the above documentation for accuracy and completeness, and I agree with the above.  Celine Collard, MD

## 2023-07-15 NOTE — Patient Instructions (Addendum)
Biopsy Wound Care Instructions  Leave the original bandage on for 24 hours if possible.  If the bandage becomes soaked or soiled before that time, it is OK to remove it and examine the wound.  A small amount of post-operative bleeding is normal.  If excessive bleeding occurs, remove the bandage, place gauze over the site and apply continuous pressure (no peeking) over the area for 30 minutes. If this does not work, please call our clinic as soon as possible or page your doctor if it is after hours.   Once a day, cleanse the wound with soap and water. It is fine to shower. If a thick crust develops you may use a Q-tip dipped into dilute hydrogen peroxide (mix 1:1 with water) to dissolve it.  Hydrogen peroxide can slow the healing process, so use it only as needed.    After washing, apply petroleum jelly (Vaseline) or an antibiotic ointment if your doctor prescribed one for you, followed by a bandage.    For best healing, the wound should be covered with a layer of ointment at all times. If you are not able to keep the area covered with a bandage to hold the ointment in place, this may mean re-applying the ointment several times a day.  Continue this wound care until the wound has healed and is no longer open.   Itching and mild discomfort is normal during the healing process. However, if you develop pain or severe itching, please call our office.   If you have any discomfort, you can take Tylenol (acetaminophen) or ibuprofen as directed on the bottle. (Please do not take these if you have an allergy to them or cannot take them for another reason).  Some redness, tenderness and white or yellow material in the wound is normal healing.  If the area becomes very sore and red, or develops a thick yellow-green material (pus), it may be infected; please notify us.    If you have stitches, return to clinic as directed to have the stitches removed. You will continue wound care for 2-3 days after the stitches  are removed.   Wound healing continues for up to one year following surgery. It is not unusual to experience pain in the scar from time to time during the interval.  If the pain becomes severe or the scar thickens, you should notify the office.    A slight amount of redness in a scar is expected for the first six months.  After six months, the redness will fade and the scar will soften and fade.  The color difference becomes less noticeable with time.  If there are any problems, return for a post-op surgery check at your earliest convenience.  To improve the appearance of the scar, you can use silicone scar gel, cream, or sheets (such as Mederma or Serica) every night for up to one year. These are available over the counter (without a prescription).  Please call our office at [phone] for any questions or concerns.       Actinic keratoses are precancerous spots that appear secondary to cumulative UV radiation exposure/sun exposure over time. They are chronic with expected duration over 1 year. A portion of actinic keratoses will progress to squamous cell carcinoma of the skin. It is not possible to reliably predict which spots will progress to skin cancer and so treatment is recommended to prevent development of skin cancer.  Recommend daily broad spectrum sunscreen SPF 30+ to sun-exposed areas, reapply every 2 hours  as needed.  Recommend staying in the shade or wearing long sleeves, sun glasses (UVA+UVB protection) and wide brim hats (4-inch brim around the entire circumference of the hat). Call for new or changing lesions.    Cryotherapy Aftercare  Wash gently with soap and water everyday.   Apply Vaseline and Band-Aid daily until healed.     Melanoma ABCDEs  Melanoma is the most dangerous type of skin cancer, and is the leading cause of death from skin disease.  You are more likely to develop melanoma if you: Have light-colored skin, light-colored eyes, or red or blond  hair Spend a lot of time in the sun Tan regularly, either outdoors or in a tanning bed Have had blistering sunburns, especially during childhood Have a close family member who has had a melanoma Have atypical moles or large birthmarks  Early detection of melanoma is key since treatment is typically straightforward and cure rates are extremely high if we catch it early.   The first sign of melanoma is often a change in a mole or a new dark spot.  The ABCDE system is a way of remembering the signs of melanoma.  A for asymmetry:  The two halves do not match. B for border:  The edges of the growth are irregular. C for color:  A mixture of colors are present instead of an even brown color. D for diameter:  Melanomas are usually (but not always) greater than 6mm - the size of a pencil eraser. E for evolution:  The spot keeps changing in size, shape, and color.  Please check your skin once per month between visits. You can use a small mirror in front and a large mirror behind you to keep an eye on the back side or your body.   If you see any new or changing lesions before your next follow-up, please call to schedule a visit.  Please continue daily skin protection including broad spectrum sunscreen SPF 30+ to sun-exposed areas, reapplying every 2 hours as needed when you're outdoors.   Staying in the shade or wearing long sleeves, sun glasses (UVA+UVB protection) and wide brim hats (4-inch brim around the entire circumference of the hat) are also recommended for sun protection.    Due to recent changes in healthcare laws, you may see results of your pathology and/or laboratory studies on MyChart before the doctors have had a chance to review them. We understand that in some cases there may be results that are confusing or concerning to you. Please understand that not all results are received at the same time and often the doctors may need to interpret multiple results in order to provide you with  the best plan of care or course of treatment. Therefore, we ask that you please give [place] 2 business days to thoroughly review all your results before contacting the office for clarification. Should we see a critical lab result, you will be contacted sooner.   If You Need Anything After Your Visit  If you have any questions or concerns for your doctor, please call our main line at [phone] and press option 4 to reach your doctor's medical assistant. If no one answers, please leave a voicemail as directed and we will return your call as soon as possible. Messages left after 4 pm will be answered the following business day.   You may also send [place] a message via MyChart. We typically respond to MyChart messages within 1-2 business days.  For prescription refills, please ask  your pharmacy to contact our office. Our fax number is [phone].  If you have an urgent issue when the clinic is closed that cannot wait until the next business day, you can page your doctor at the number below.    Please note that while we do our best to be available for urgent issues outside of office hours, we are not available 24/7.   If you have an urgent issue and are unable to reach [place], you may choose to seek medical care at your doctor's office, retail clinic, urgent care center, or emergency room.  If you have a medical emergency, please immediately call 911 or go to the emergency department.  Pager Numbers  - Dr. Gwen Pounds: [phone]  - Dr. Roseanne Reno: [phone]  - Dr. Katrinka Blazing: [phone]   In the event of inclement weather, please call our main line at [phone] for an update on the status of any delays or closures.  Dermatology Medication Tips: Please keep the boxes that topical medications come in in order to help keep track of the instructions about where and how to use these. Pharmacies typically print the medication instructions only on the boxes and not directly on the medication tubes.   If  your medication is too expensive, please contact our office at [phone] option 4 or send [place] a message through MyChart.   We are unable to tell what your co-pay for medications will be in advance as this is different depending on your insurance coverage. However, we may be able to find a substitute medication at lower cost or fill out paperwork to get insurance to cover a needed medication.   If a prior authorization is required to get your medication covered by your insurance company, please allow [place] 1-2 business days to complete this process.  Drug prices often vary depending on where the prescription is filled and some pharmacies may offer cheaper prices.  The website www.goodrx.com contains coupons for medications through different pharmacies. The prices here do not account for what the cost may be with help from insurance (it may be cheaper with your insurance), but the website can give you the price if you did not use any insurance.  - You can print the associated coupon and take it with your prescription to the pharmacy.  - You may also stop by our office during regular business hours and pick up a GoodRx coupon card.  - If you need your prescription sent electronically to a different pharmacy, notify our office through [facility] or by phone at [phone] option 4.     Si Usted Necesita Algo Despus de Su Visita  Tambin puede enviarnos un mensaje a travs de Clinical cytogeneticist. Por lo general respondemos a los mensajes de MyChart en el transcurso de 1 a 2 das hbiles.  Para renovar recetas, por favor pida a su farmacia que se ponga en contacto con nuestra oficina. Annie Sable de fax es [place] [phone].  Si tiene un asunto urgente cuando la clnica est cerrada y que no puede esperar hasta el siguiente da hbil, puede llamar/localizar a su doctor(a) al nmero que aparece a continuacin.   Por favor, tenga en cuenta que aunque hacemos todo lo posible para estar disponibles para  asuntos urgentes fuera del horario de [place], no estamos disponibles las 24 horas del da, los 7 [address]  [address] de la [place].   Si tiene un problema urgente y no puede comunicarse con nosotros, puede optar por buscar atencin Sports administrator de su  doctor(a), en una clnica privada, en un centro de atencin urgente o en una sala de emergencias.  Si tiene Engineer, drilling, por favor llame inmediatamente al 911 o vaya a la sala de emergencias.  Nmeros de bper  - Dr. Gwen Pounds: [phone]  - Dra. Roseanne Reno: [phone]  - Dr. Katrinka Blazing: [phone]   En caso de inclemencias del tiempo, por favor llame a Lacy Duverney principal al [phone] para una actualizacin sobre el [place] de cualquier retraso o cierre.  Consejos para la medicacin en dermatologa: Por favor, guarde las cajas en las que vienen los medicamentos de uso tpico para ayudarle a seguir las instrucciones sobre dnde y cmo usarlos. Las farmacias generalmente imprimen las instrucciones del medicamento slo en las cajas y no directamente en los tubos del [place].   Si su medicamento es muy caro, por favor, pngase en contacto con Rolm Gala llamando al [phone] y presione la opcin 4 o envenos un mensaje a travs de Clinical cytogeneticist.   No podemos decirle cul ser su copago por los medicamentos por adelantado ya que esto es diferente dependiendo de la cobertura de su seguro. Sin embargo, es posible que podamos encontrar un medicamento sustituto a Audiological scientist un formulario para que el seguro cubra el medicamento que se considera necesario.   Si se requiere una autorizacin previa para que su compaa de seguros [place] su medicamento, por favor permtanos de 1 a 2 das hbiles para completar [address]  Los precios de los medicamentos varan con frecuencia dependiendo del Environmental consultant de dnde se surte la receta y alguna farmacias pueden ofrecer precios ms baratos.  El sitio web www.goodrx.com tiene cupones para  medicamentos de Health and safety inspector. Los precios aqu no tienen en cuenta lo que podra costar con la ayuda del seguro (puede ser ms barato con su seguro), pero el sitio web puede darle el precio si no utiliz Tourist information centre manager.  - Puede imprimir el cupn correspondiente y llevarlo con su receta a la farmacia.  - Tambin puede pasar por nuestra oficina durante el horario de atencin regular y Education officer, museum una tarjeta de cupones de GoodRx.  - Si necesita que su receta se enve electrnicamente a una farmacia diferente, informe a nuestra oficina a travs de MyChart de [place] o por telfono llamando al [phone] y presione la opcin 4.

## 2023-07-19 LAB — SURGICAL PATHOLOGY

## 2023-07-22 ENCOUNTER — Encounter: Payer: Self-pay | Admitting: Dermatology

## 2023-07-23 ENCOUNTER — Ambulatory Visit: Payer: Self-pay

## 2023-07-23 DIAGNOSIS — C4401 Basal cell carcinoma of skin of lip: Secondary | ICD-10-CM

## 2023-07-23 NOTE — Telephone Encounter (Addendum)
 Tried calling patient to discuss bx results and Mohs referral . No answer. LM for patient to return call.    ----- Message from Celine Collard sent at 07/22/2023  5:16 PM EDT ----- FINAL DIAGNOSIS        1. Skin, left upper lip :       BASAL CELL CARCINOMA, NODULAR AND INFILTRATIVE PATTERNS   Cancer = BCC Infiltrative Schedule for MOHS (Dr Fain Home?)

## 2023-07-23 NOTE — Telephone Encounter (Signed)
 Patient advised of BX result but wants to discuss [facility] referral with spouse, will call back next week to schedule. aw

## 2023-08-06 NOTE — Telephone Encounter (Signed)
 Patient returned phone call this morning and would like referral to Dr. Regan Cao office. Referral sent. aw

## 2023-08-06 NOTE — Addendum Note (Signed)
 Addended by: Lisbeth Rides R on: 08/06/2023 09:09 AM   Modules accepted: Orders

## 2023-08-27 DIAGNOSIS — C4401 Basal cell carcinoma of skin of lip: Secondary | ICD-10-CM | POA: Diagnosis not present

## 2023-09-12 ENCOUNTER — Telehealth: Payer: Self-pay

## 2023-09-12 NOTE — Telephone Encounter (Signed)
 Specimen tracking and history updated from [facility] progress notes of BCC-L upper lip by Dr. Bluford on 08/27/23. aw

## 2023-12-05 DIAGNOSIS — Z8546 Personal history of malignant neoplasm of prostate: Secondary | ICD-10-CM | POA: Diagnosis not present

## 2023-12-05 DIAGNOSIS — R5383 Other fatigue: Secondary | ICD-10-CM | POA: Diagnosis not present

## 2023-12-05 DIAGNOSIS — Z125 Encounter for screening for malignant neoplasm of prostate: Secondary | ICD-10-CM | POA: Diagnosis not present

## 2023-12-05 DIAGNOSIS — E7800 Pure hypercholesterolemia, unspecified: Secondary | ICD-10-CM | POA: Diagnosis not present

## 2023-12-05 DIAGNOSIS — Z79899 Other long term (current) drug therapy: Secondary | ICD-10-CM | POA: Diagnosis not present

## 2023-12-05 DIAGNOSIS — E538 Deficiency of other specified B group vitamins: Secondary | ICD-10-CM | POA: Diagnosis not present

## 2023-12-05 DIAGNOSIS — I10 Essential (primary) hypertension: Secondary | ICD-10-CM | POA: Diagnosis not present

## 2024-07-14 ENCOUNTER — Ambulatory Visit: Admitting: Dermatology
